# Patient Record
Sex: Male | Born: 1985 | Race: Black or African American | Hispanic: No | Marital: Single | Smoking: Current every day smoker
Health system: Southern US, Community
[De-identification: ages and names within clinical notes are randomized; demographics above are authoritative.]

## PROBLEM LIST (undated history)

## (undated) DIAGNOSIS — F329 Major depressive disorder, single episode, unspecified: Secondary | ICD-10-CM

## (undated) DIAGNOSIS — T4145XA Adverse effect of unspecified anesthetic, initial encounter: Secondary | ICD-10-CM

## (undated) DIAGNOSIS — Z87442 Personal history of urinary calculi: Secondary | ICD-10-CM

## (undated) DIAGNOSIS — J45909 Unspecified asthma, uncomplicated: Secondary | ICD-10-CM

## (undated) DIAGNOSIS — F32A Depression, unspecified: Secondary | ICD-10-CM

## (undated) DIAGNOSIS — Z8719 Personal history of other diseases of the digestive system: Secondary | ICD-10-CM

## (undated) DIAGNOSIS — Z8711 Personal history of peptic ulcer disease: Secondary | ICD-10-CM

## (undated) DIAGNOSIS — T8859XA Other complications of anesthesia, initial encounter: Secondary | ICD-10-CM

## (undated) DIAGNOSIS — F1410 Cocaine abuse, uncomplicated: Secondary | ICD-10-CM

## (undated) HISTORY — DX: Personal history of other diseases of the digestive system: Z87.19

## (undated) HISTORY — DX: Depression, unspecified: F32.A

## (undated) HISTORY — DX: Major depressive disorder, single episode, unspecified: F32.9

## (undated) HISTORY — DX: Personal history of peptic ulcer disease: Z87.11

## (undated) HISTORY — DX: Unspecified asthma, uncomplicated: J45.909

## (undated) HISTORY — DX: Personal history of urinary calculi: Z87.442

---

## 2005-10-23 ENCOUNTER — Other Ambulatory Visit: Payer: Self-pay

## 2005-10-23 ENCOUNTER — Emergency Department: Payer: Self-pay | Admitting: Emergency Medicine

## 2006-10-03 ENCOUNTER — Emergency Department: Payer: Self-pay | Admitting: Emergency Medicine

## 2012-07-06 ENCOUNTER — Emergency Department: Payer: Self-pay | Admitting: Emergency Medicine

## 2012-07-08 LAB — BETA STREP CULTURE(ARMC)

## 2012-08-02 ENCOUNTER — Emergency Department: Payer: Self-pay | Admitting: Unknown Physician Specialty

## 2012-08-02 LAB — URINALYSIS, COMPLETE
Blood: NEGATIVE
Glucose,UR: NEGATIVE mg/dL (ref 0–75)
Ketone: NEGATIVE
Ph: 7 (ref 4.5–8.0)
Protein: NEGATIVE
RBC,UR: 1 /HPF (ref 0–5)
Specific Gravity: 1.021 (ref 1.003–1.030)
WBC UR: 4 /HPF (ref 0–5)

## 2012-08-02 LAB — CBC
HCT: 51.9 % (ref 40.0–52.0)
HGB: 17.2 g/dL (ref 13.0–18.0)
Platelet: 201 10*3/uL (ref 150–440)
RBC: 5.92 10*6/uL — ABNORMAL HIGH (ref 4.40–5.90)
RDW: 13.9 % (ref 11.5–14.5)
WBC: 10.9 10*3/uL — ABNORMAL HIGH (ref 3.8–10.6)

## 2012-08-02 LAB — BASIC METABOLIC PANEL
Anion Gap: 4 — ABNORMAL LOW (ref 7–16)
BUN: 19 mg/dL — ABNORMAL HIGH (ref 7–18)
Calcium, Total: 9.1 mg/dL (ref 8.5–10.1)
Chloride: 105 mmol/L (ref 98–107)
Co2: 29 mmol/L (ref 21–32)
Creatinine: 0.98 mg/dL (ref 0.60–1.30)
EGFR (African American): >60
EGFR (Non-African Amer.): >60
Glucose: 88 mg/dL (ref 65–99)
Osmolality: 277 (ref 275–301)
Potassium: 3.9 mmol/L (ref 3.5–5.1)

## 2012-08-02 LAB — HCG, QUANTITATIVE, PREGNANCY: Beta Hcg, Quant.: <1 m[IU]/mL — ABNORMAL LOW

## 2012-08-02 LAB — LACTATE DEHYDROGENASE: LDH: 203 U/L (ref 85–241)

## 2012-08-04 LAB — AFP TUMOR MARKER: AFP-Tumor Marker: 1.7 ng/mL (ref 0.0–8.3)

## 2013-06-17 DIAGNOSIS — F17200 Nicotine dependence, unspecified, uncomplicated: Secondary | ICD-10-CM | POA: Insufficient documentation

## 2015-01-20 ENCOUNTER — Encounter
Admission: EM | Disposition: A | Discharge status: Home / Self Care | Payer: Self-pay | Source: Home / Self Care | Attending: Emergency Medicine

## 2015-01-20 ENCOUNTER — Emergency Department: Payer: Self-pay | Admitting: Anesthesiology

## 2015-01-20 ENCOUNTER — Encounter: Payer: Self-pay | Admitting: Emergency Medicine

## 2015-01-20 ENCOUNTER — Emergency Department: Payer: Self-pay

## 2015-01-20 ENCOUNTER — Ambulatory Visit
Admission: EM | Admit: 2015-01-20 | Discharge: 2015-01-21 | Disposition: A | Discharge status: Home / Self Care | Payer: Self-pay | Attending: Urology | Admitting: Urology

## 2015-01-20 DIAGNOSIS — N132 Hydronephrosis with renal and ureteral calculous obstruction: Secondary | ICD-10-CM | POA: Insufficient documentation

## 2015-01-20 DIAGNOSIS — N200 Calculus of kidney: Secondary | ICD-10-CM

## 2015-01-20 DIAGNOSIS — N134 Hydroureter: Secondary | ICD-10-CM | POA: Insufficient documentation

## 2015-01-20 DIAGNOSIS — N1330 Unspecified hydronephrosis: Secondary | ICD-10-CM

## 2015-01-20 DIAGNOSIS — N201 Calculus of ureter: Secondary | ICD-10-CM

## 2015-01-20 DIAGNOSIS — Z91018 Allergy to other foods: Secondary | ICD-10-CM | POA: Insufficient documentation

## 2015-01-20 DIAGNOSIS — F17200 Nicotine dependence, unspecified, uncomplicated: Secondary | ICD-10-CM | POA: Insufficient documentation

## 2015-01-20 DIAGNOSIS — Z91048 Other nonmedicinal substance allergy status: Secondary | ICD-10-CM | POA: Insufficient documentation

## 2015-01-20 DIAGNOSIS — R109 Unspecified abdominal pain: Secondary | ICD-10-CM

## 2015-01-20 DIAGNOSIS — R112 Nausea with vomiting, unspecified: Secondary | ICD-10-CM

## 2015-01-20 DIAGNOSIS — R1011 Right upper quadrant pain: Secondary | ICD-10-CM

## 2015-01-20 HISTORY — PX: CYSTOSCOPY WITH STENT PLACEMENT: SHX5790

## 2015-01-20 LAB — CBC WITH DIFFERENTIAL/PLATELET
BASOS ABS: 0.1 10*3/uL (ref 0–0.1)
BASOS PCT: 1 %
EOS ABS: 0.2 10*3/uL (ref 0–0.7)
EOS PCT: 2 %
HCT: 50.8 % (ref 40.0–52.0)
Hemoglobin: 16.7 g/dL (ref 13.0–18.0)
Lymphocytes Relative: 27 %
Lymphs Abs: 3.3 10*3/uL (ref 1.0–3.6)
MCH: 28.7 pg (ref 26.0–34.0)
MCHC: 32.8 g/dL (ref 32.0–36.0)
MCV: 87.5 fL (ref 80.0–100.0)
MONO ABS: 0.9 10*3/uL (ref 0.2–1.0)
Monocytes Relative: 8 %
Neutro Abs: 7.8 10*3/uL — ABNORMAL HIGH (ref 1.4–6.5)
Neutrophils Relative %: 62 %
PLATELETS: 213 10*3/uL (ref 150–440)
RBC: 5.81 MIL/uL (ref 4.40–5.90)
RDW: 13.6 % (ref 11.5–14.5)
WBC: 12.3 10*3/uL — ABNORMAL HIGH (ref 3.8–10.6)

## 2015-01-20 LAB — BASIC METABOLIC PANEL
ANION GAP: 9 (ref 5–15)
BUN: 14 mg/dL (ref 6–20)
CALCIUM: 9.6 mg/dL (ref 8.9–10.3)
CO2: 26 mmol/L (ref 22–32)
Chloride: 105 mmol/L (ref 101–111)
Creatinine, Ser: 1.13 mg/dL (ref 0.61–1.24)
GLUCOSE: 112 mg/dL — AB (ref 65–99)
Potassium: 3.4 mmol/L — ABNORMAL LOW (ref 3.5–5.1)
Sodium: 140 mmol/L (ref 135–145)

## 2015-01-20 LAB — URINALYSIS COMPLETE WITH MICROSCOPIC (ARMC ONLY)
Bilirubin Urine: NEGATIVE
Glucose, UA: NEGATIVE mg/dL
LEUKOCYTES UA: NEGATIVE
NITRITE: NEGATIVE
PROTEIN: 100 mg/dL — AB
SQUAMOUS EPITHELIAL / LPF: NONE SEEN
Specific Gravity, Urine: 1.027 (ref 1.005–1.030)
pH: 6 (ref 5.0–8.0)

## 2015-01-20 LAB — URINE DRUG SCREEN, QUALITATIVE (ARMC ONLY)
AMPHETAMINES, UR SCREEN: NOT DETECTED
Barbiturates, Ur Screen: NOT DETECTED
Benzodiazepine, Ur Scrn: NOT DETECTED
Cannabinoid 50 Ng, Ur ~~LOC~~: NOT DETECTED
Cocaine Metabolite,Ur ~~LOC~~: POSITIVE — AB
MDMA (ECSTASY) UR SCREEN: NOT DETECTED
METHADONE SCREEN, URINE: NOT DETECTED
Opiate, Ur Screen: POSITIVE — AB
PHENCYCLIDINE (PCP) UR S: NOT DETECTED
Tricyclic, Ur Screen: NOT DETECTED

## 2015-01-20 SURGERY — CYSTOSCOPY, WITH STENT INSERTION
Anesthesia: General | Site: Ureter | Laterality: Right | Wound class: Clean Contaminated

## 2015-01-20 MED ORDER — FENTANYL CITRATE (PF) 100 MCG/2ML IJ SOLN
INTRAMUSCULAR | Status: DC | PRN
  Administered 2015-01-20 (×2): 50 ug via INTRAVENOUS

## 2015-01-20 MED ORDER — LACTATED RINGERS IV SOLN
INTRAVENOUS | Status: DC | PRN
  Administered 2015-01-20: 14:00:00 via INTRAVENOUS

## 2015-01-20 MED ORDER — SENNOSIDES-DOCUSATE SODIUM 8.6-50 MG PO TABS
2.0000 | ORAL_TABLET | Freq: Every day | ORAL | Status: DC
  Filled 2015-01-20: qty 2

## 2015-01-20 MED ORDER — ACETAMINOPHEN 325 MG PO TABS: 650.0000 mg | ORAL_TABLET | ORAL | Status: DC | PRN

## 2015-01-20 MED ORDER — KETOROLAC TROMETHAMINE 30 MG/ML IJ SOLN
30.0000 mg | Freq: Once | INTRAMUSCULAR | Status: AC
  Administered 2015-01-20: 30 mg via INTRAVENOUS
  Filled 2015-01-20: qty 1

## 2015-01-20 MED ORDER — DIPHENHYDRAMINE HCL 50 MG/ML IJ SOLN: 12.5000 mg | Freq: Four times a day (QID) | INTRAMUSCULAR | Status: DC | PRN

## 2015-01-20 MED ORDER — LIDOCAINE HCL (CARDIAC) 20 MG/ML IV SOLN
INTRAVENOUS | Status: DC | PRN
  Administered 2015-01-20: 60 mg via INTRAVENOUS

## 2015-01-20 MED ORDER — ZOLPIDEM TARTRATE 5 MG PO TABS: 5.0000 mg | ORAL_TABLET | Freq: Every evening | ORAL | Status: DC | PRN

## 2015-01-20 MED ORDER — HYDROMORPHONE HCL 1 MG/ML IJ SOLN
1.0000 mg | Freq: Once | INTRAMUSCULAR | Status: AC
  Administered 2015-01-20: 1 mg via INTRAVENOUS

## 2015-01-20 MED ORDER — OXYCODONE-ACETAMINOPHEN 5-325 MG PO TABS
1.0000 | ORAL_TABLET | ORAL | Status: DC | PRN
  Administered 2015-01-20: 1 via ORAL
  Administered 2015-01-21: 2 via ORAL
  Filled 2015-01-20 (×3): qty 1

## 2015-01-20 MED ORDER — FENTANYL CITRATE (PF) 100 MCG/2ML IJ SOLN: 25.0000 ug | INTRAMUSCULAR | Status: DC | PRN

## 2015-01-20 MED ORDER — PROPOFOL 10 MG/ML IV BOLUS
INTRAVENOUS | Status: DC | PRN
  Administered 2015-01-20: 200 mg via INTRAVENOUS

## 2015-01-20 MED ORDER — IOTHALAMATE MEGLUMINE 17.2 % UR SOLN
URETHRAL | Status: DC | PRN
  Administered 2015-01-20: 10 mL via URETHRAL

## 2015-01-20 MED ORDER — DIPHENHYDRAMINE HCL 12.5 MG/5ML PO ELIX: 12.5000 mg | ORAL_SOLUTION | Freq: Four times a day (QID) | ORAL | Status: DC | PRN

## 2015-01-20 MED ORDER — ONDANSETRON HCL 4 MG/2ML IJ SOLN
INTRAMUSCULAR | Status: AC
  Administered 2015-01-20: 4 mg via INTRAVENOUS
  Filled 2015-01-20: qty 2

## 2015-01-20 MED ORDER — ONDANSETRON HCL 4 MG/2ML IJ SOLN
4.0000 mg | Freq: Once | INTRAMUSCULAR | Status: AC
  Administered 2015-01-20: 4 mg via INTRAVENOUS
  Filled 2015-01-20: qty 2

## 2015-01-20 MED ORDER — CEFAZOLIN SODIUM-DEXTROSE 2-3 GM-% IV SOLR
INTRAVENOUS | Status: DC | PRN
  Administered 2015-01-20: 2 g via INTRAVENOUS

## 2015-01-20 MED ORDER — PNEUMOCOCCAL VAC POLYVALENT 25 MCG/0.5ML IJ INJ
0.5000 mL | INJECTION | INTRAMUSCULAR | Status: AC
  Administered 2015-01-21: 0.5 mL via INTRAMUSCULAR
  Filled 2015-01-20: qty 0.5

## 2015-01-20 MED ORDER — ONDANSETRON HCL 4 MG/2ML IJ SOLN: 4.0000 mg | Freq: Once | INTRAMUSCULAR | Status: DC | PRN

## 2015-01-20 MED ORDER — MAGNESIUM CITRATE PO SOLN
1.0000 | Freq: Once | ORAL | Status: DC | PRN
  Filled 2015-01-20: qty 296

## 2015-01-20 MED ORDER — OXYCODONE-ACETAMINOPHEN 10-325 MG PO TABS: 1.0000 | ORAL_TABLET | ORAL | Status: DC | PRN

## 2015-01-20 MED ORDER — SODIUM CHLORIDE 0.9 % IV BOLUS (SEPSIS)
1000.0000 mL | Freq: Once | INTRAVENOUS | Status: AC
  Administered 2015-01-20: 1000 mL via INTRAVENOUS

## 2015-01-20 MED ORDER — NEOSTIGMINE METHYLSULFATE 10 MG/10ML IV SOLN
INTRAVENOUS | Status: DC | PRN
  Administered 2015-01-20: 2 mg via INTRAVENOUS

## 2015-01-20 MED ORDER — HYDROMORPHONE HCL 1 MG/ML IJ SOLN
0.5000 mg | INTRAMUSCULAR | Status: DC | PRN
  Administered 2015-01-21 (×2): 1 mg via INTRAVENOUS
  Filled 2015-01-20 (×2): qty 1

## 2015-01-20 MED ORDER — HYDROMORPHONE HCL 1 MG/ML IJ SOLN
1.0000 mg | Freq: Once | INTRAMUSCULAR | Status: AC
  Administered 2015-01-20: 1 mg via INTRAVENOUS
  Filled 2015-01-20: qty 1

## 2015-01-20 MED ORDER — POLYETHYLENE GLYCOL 3350 17 G PO PACK: 17.0000 g | PACK | Freq: Every day | ORAL | Status: DC | PRN

## 2015-01-20 MED ORDER — GLYCOPYRROLATE 0.2 MG/ML IJ SOLN
INTRAMUSCULAR | Status: DC | PRN
  Administered 2015-01-20: 0.4 mg via INTRAVENOUS

## 2015-01-20 MED ORDER — OXYBUTYNIN CHLORIDE 5 MG PO TABS: 5.0000 mg | ORAL_TABLET | Freq: Three times a day (TID) | ORAL | Status: DC

## 2015-01-20 MED ORDER — ROCURONIUM BROMIDE 100 MG/10ML IV SOLN
INTRAVENOUS | Status: DC | PRN
  Administered 2015-01-20: 15 mg via INTRAVENOUS
  Administered 2015-01-20: 5 mg via INTRAVENOUS

## 2015-01-20 MED ORDER — DEXMEDETOMIDINE HCL 200 MCG/2ML IV SOLN
INTRAVENOUS | Status: DC | PRN
  Administered 2015-01-20: 10 ug via INTRAVENOUS

## 2015-01-20 MED ORDER — BELLADONNA ALKALOIDS-OPIUM 16.2-60 MG RE SUPP
RECTAL | Status: DC | PRN
  Administered 2015-01-20: 1 via RECTAL

## 2015-01-20 MED ORDER — BISACODYL 10 MG RE SUPP: 10.0000 mg | Freq: Every day | RECTAL | Status: DC | PRN

## 2015-01-20 MED ORDER — HYDROMORPHONE HCL 1 MG/ML IJ SOLN
INTRAMUSCULAR | Status: AC
  Administered 2015-01-20: 1 mg via INTRAVENOUS
  Filled 2015-01-20: qty 1

## 2015-01-20 MED ORDER — FENTANYL CITRATE (PF) 100 MCG/2ML IJ SOLN
INTRAMUSCULAR | Status: AC
  Filled 2015-01-20: qty 2

## 2015-01-20 MED ORDER — TAMSULOSIN HCL 0.4 MG PO CAPS: 0.4000 mg | ORAL_CAPSULE | Freq: Every day | ORAL | Status: DC

## 2015-01-20 MED ORDER — HYDROMORPHONE HCL 1 MG/ML IJ SOLN
1.0000 mg | INTRAMUSCULAR | Status: AC
  Administered 2015-01-20: 1 mg via INTRAVENOUS
  Filled 2015-01-20: qty 1

## 2015-01-20 MED ORDER — SUCCINYLCHOLINE CHLORIDE 20 MG/ML IJ SOLN
INTRAMUSCULAR | Status: DC | PRN
  Administered 2015-01-20: 100 mg via INTRAVENOUS

## 2015-01-20 MED ORDER — MIDAZOLAM HCL 2 MG/2ML IJ SOLN
INTRAMUSCULAR | Status: DC | PRN
  Administered 2015-01-20: 2 mg via INTRAVENOUS

## 2015-01-20 MED ORDER — ONDANSETRON HCL 4 MG/2ML IJ SOLN
4.0000 mg | Freq: Once | INTRAMUSCULAR | Status: AC
  Administered 2015-01-20: 4 mg via INTRAVENOUS

## 2015-01-20 MED ORDER — KETOROLAC TROMETHAMINE 30 MG/ML IJ SOLN
30.0000 mg | Freq: Four times a day (QID) | INTRAMUSCULAR | Status: DC
  Administered 2015-01-20 – 2015-01-21 (×3): 30 mg via INTRAVENOUS
  Filled 2015-01-20 (×3): qty 1

## 2015-01-20 MED ORDER — DIAZEPAM 5 MG PO TABS: 5.0000 mg | ORAL_TABLET | Freq: Three times a day (TID) | ORAL | Status: DC | PRN

## 2015-01-20 MED ORDER — ONDANSETRON HCL 4 MG/2ML IJ SOLN: 4.0000 mg | INTRAMUSCULAR | Status: DC | PRN

## 2015-01-20 SURGICAL SUPPLY — 23 items
BAG URO CATCHER STRL LF (DRAPE) ×3 IMPLANT
BASKET LASER NITINOL 1.9FR (BASKET) IMPLANT
BASKET STONE 1.7 NGAGE (UROLOGICAL SUPPLIES) IMPLANT
BASKET ZERO TIP NITINOL 2.4FR (BASKET) IMPLANT
CANISTER SUCT LVC 12 LTR MEDI- (MISCELLANEOUS) IMPLANT
CATH INTERMIT  6FR 70CM (CATHETERS) IMPLANT
CATH URETL OPEN END 6X70 (CATHETERS) ×3 IMPLANT
CLOTH BEACON ORANGE TIMEOUT ST (SAFETY) ×3 IMPLANT
FIBER LASER FLEXIVA 365 (UROLOGICAL SUPPLIES) IMPLANT
FIBER LASER TRAC TIP (UROLOGICAL SUPPLIES) IMPLANT
GLOVE BIO SURGEON STRL SZ8 (GLOVE) IMPLANT
GOWN STRL REUS W/ TWL LRG LVL3 (GOWN DISPOSABLE) ×1 IMPLANT
GOWN STRL REUS W/ TWL XL LVL3 (GOWN DISPOSABLE) ×1 IMPLANT
GOWN STRL REUS W/TWL LRG LVL3 (GOWN DISPOSABLE) ×2
GOWN STRL REUS W/TWL XL LVL3 (GOWN DISPOSABLE) ×2
GUIDEWIRE ANG ZIPWIRE 038X150 (WIRE) ×3 IMPLANT
GUIDEWIRE STR DUAL SENSOR (WIRE) IMPLANT
IV NS IRRIG 3000ML ARTHROMATIC (IV SOLUTION) ×3 IMPLANT
MANIFOLD NEPTUNE II (INSTRUMENTS) IMPLANT
PACK CYSTO (CUSTOM PROCEDURE TRAY) ×3 IMPLANT
STENT URET 6FRX26 CONTOUR (STENTS) ×3 IMPLANT
SYRINGE 10CC LL (SYRINGE) ×3 IMPLANT
TUBE FEEDING 8FR 16IN STR KANG (MISCELLANEOUS) IMPLANT

## 2015-01-20 NOTE — H&P (Signed)
   Urology Consult  I have been asked to see the patient by Dr. Quale, for evaluation and management of  9 x 5 x 4 mm calculus in the right proximal ureter associated with mild right hydronephrosis and hydroureter proximal to this stone.    Chief Complaint: Intractable right flank pain   History of Present Illness: Nicolas Shaw is a 29 y.o. year old African-American male who presented to the emergency room earlier today complaining of an intractable right sided flank pain.  The patient's mother is present with him in the room. I have obtained the history of this patient from his mother due to patient's extreme lethargy caused by pain medication.  Patient's mother states that earlier in the evening yesterday he started complaining of a right sided pain. He described it as a nagging pain at that time and continued to work. As the evening progressed, the pain increased and became so intense that after his shift his mother brought him to the emergency room.  She states that she did not know if he had fevers or chills, but he had nausea and some vomiting.  She is unsure if he has had gross hematuria, dysuria or urinary urgency.  She denies that he has a prior history of kidney stone disease.    A CT scan was obtained and is on the 9 x 4 x 5 mm calculus in the right proximal ureter associated with right hydronephrosis and hydroureter. It also found a 1.9 cm hypodense lesion of the right upper pole of the kidney.  Dr. McKenzie was consult it and patient will be scheduled for an emergent right ureteral stent placement.  History obtained from the ED notes:  Patient was seen at [facility] 3 weeks ago for similar complaints along with hematuria. He had a CT scan and states he was diagnosed with a "small kidney stone" but was unable to follow up with urology due to transportation and financial issues. Complains of symptoms associated with nausea. Denies fever, chills, chest pain, shortness of breath, vomiting,  diarrhea, dysuria, testicular pain or swelling.  History reviewed. No pertinent past medical history.  History reviewed. No pertinent past surgical history.  Home Medications:    Medication List    Notice    You have not been prescribed any medications.      Allergies:  Allergies  Allergen Reactions  . Other Swelling    Pt reports allergic to ALL fruits except peaches and watermelon.  . Silver Swelling    Tongue and throat    No family history on file.  Social History: Patient has a history of cocaine use.  ROS: A complete review of systems was performed.  All systems are negative except for pertinent findings as noted.  Physical Exam:  Vital signs in last 24 hours: Temp:  [98 F (36.7 C)] 98 F (36.7 C) (09/01 0614) Pulse Rate:  [56-84] 56 (09/01 0900) Resp:  [18] 18 (09/01 0614) BP: (142-170)/(89-115) 146/96 mmHg (09/01 0900) SpO2:  [97 %-99 %] 97 % (09/01 0900) Weight:  [205 lb (92.987 kg)] 205 lb (92.987 kg) (09/01 0614) Constitutional:  Lethargic, No acute distress. HEENT: [place] AT, moist mucus membranes.  Trachea midline, no masses Cardiovascular: Regular rate and rhythm, no clubbing, cyanosis, or edema. Respiratory: Normal respiratory effort, lungs clear bilaterally GI: Abdomen is soft, nontender, nondistended, no abdominal masses GU: No CVA tenderness Skin: No rashes, bruises or suspicious lesions Lymph: No cervical or inguinal adenopathy Neurologic: Grossly intact, no focal deficits,   moving all 4 extremities Psychiatric: Normal mood and affect   Laboratory Data:   Recent Labs  01/20/15 0626  WBC 12.3*  HGB 16.7  HCT 50.8    Recent Labs  01/20/15 0626  NA 140  K 3.4*  CL 105  CO2 26  GLUCOSE 112*  BUN 14  CREATININE 1.13  CALCIUM 9.6   No results for input(s): LABPT, INR in the last 72 hours. No results for input(s): LABURIN in the last 72 hours. Results for orders placed or performed in visit on 07/06/12  Beta Strep Culture ([facility])      Status: None   Collection Time: 07/06/12  2:36 AM  Result Value Ref Range Status   Micro Text Report   Final       SOURCE: THROAT    COMMENT                   NO BETA STREPTOCOCCUS ISOLATED IN 48 HOURS   ANTIBIOTIC                                                         Radiologic Imaging: Dg Abd 1 View  01/20/2015   CLINICAL DATA:  Right-sided abdominal pain.  EXAM: ABDOMEN - 1 VIEW  COMPARISON:  None.  FINDINGS: The bowel gas pattern is unremarkable. There is scattered stool in the colon. No distended small bowel loops to suggest obstruction. No free air. The soft tissue shadows are maintained. Linear radiodensity overlying the right L2 transverse process could potentially be a ureteral calculus. The bony structures are unremarkable.  IMPRESSION: Unremarkable bowel gas pattern.  Possible upper right ureteral calculus. CT urogram may be helpful for further evaluation if clinically necessary   Electronically Signed   By: P.  Gallerani M.D.   On: 01/20/2015 07:24   Ct Renal Stone Study  01/20/2015   CLINICAL DATA:  Right lower abdominal pain and flank pain for 45 minutes. Prior hematuria and renal calculus.  EXAM: CT ABDOMEN AND PELVIS WITHOUT CONTRAST  TECHNIQUE: Multidetector CT imaging of the abdomen and pelvis was performed following the standard protocol without IV contrast.  COMPARISON:  01/20/2015  FINDINGS: Lower chest:  Unremarkable  Hepatobiliary: Unremarkable  Pancreas: Unremarkable  Spleen: Unremarkable  Adrenals/Urinary Tract: 9 by 5 by 4 mm calculus in the right proximal ureter associated with mild right hydronephrosis and hydroureter proximal to this stone.  1-2 mm left kidney upper pole nonobstructive calculus. Hypodense lesion in the right kidney upper pole measuring 1.9 cm in diameter, with very faint calcifications along its margin.  Stomach/Bowel: Unremarkable  Vascular/Lymphatic: Unremarkable  Reproductive: Unremarkable  Other: No supplemental non-categorized findings.   Musculoskeletal: Unremarkable  IMPRESSION: 1. 9 by 5 by 4 mm mildly obstructive right proximal ureteral calculus with associated mild right hydronephrosis. 2. There is also a 2 mm nonobstructive left kidney upper pole calculus. 3. 1.9 cm hypodense lesion of the right kidney upper pole has faint peripheral calcifications. Although statistically most likely to be a complex cyst, a solid mass is not entirely excluded and a Bosniak classification cannot be assigned due to lack of IV contrast. Definitive workup by renal protocol MRI with and without contrast should be considered; alternatives might include renal protocol CT with and without contrast or ultrasound.   Electronically Signed   By: Walter  Liebkemann   M.D.   On: 01/20/2015 08:14    Impression/Assessment:  Patient with a calculus in the right proximal ureter associated with mid right hydronephrosis and hydroureter proximal to the stone. His pain is intractable.  There is also a finding of a 1.9 cm hypodense lesion of the right kidney upper pole which has faint peripheral calcifications.   Plan:  Patient will be scheduled for an emergent right ureteral stent placement. The procedures and risk involved are explained to the patient's mother. She voices her understanding.  After the ureteral stent placement, patient will be discharged to home and will follow-up with urology in 1-2 weeks for definitive treatment for his proximal stone.  The hypodense lesion of the right kidney will also be worked up as an outpatient.   01/20/2015, 10:32 AM   Thank you for involving me in this patient's care, I will continue to follow along.Please page with any further questions or concerns. Daryn Pisani, PA-C

## 2015-01-20 NOTE — ED Provider Notes (Signed)
[facility] Emergency Department Provider Note  ____________________________________________  Time seen: Approximately 6:23 AM  I have reviewed the triage vital signs and the nursing notes.   HISTORY  Chief Complaint Abdominal Pain    HPI Ellington Greenslade is a 29 y.o. male who presents to the ED from home with a chief complain of right flank pain. Patient presents with sudden onset right flank pain approximately 45 minutes ago. Describes waxing/waning sharp pain radiating to right lower quadrant. Patient was seen at [facility] 3 weeks ago for similar complaints along with hematuria. He had a CT scan and states he was diagnosed with a "small kidney stone" but was unable to follow up with urology due to transportation and financial issues. Complains of symptoms associated with nausea. Denies fever, chills, chest pain, shortness of breath, vomiting, diarrhea, dysuria, testicular pain or swelling.   Past medical history None   There are no active problems to display for this patient.   History reviewed. No pertinent past surgical history.  No current outpatient prescriptions on file.  Allergies Silver  Family history Diabetes   Social History Social History  Substance Use Topics  . Smoking status: None  . Smokeless tobacco: None  . Alcohol Use: None  Smoker  Review of Systems Constitutional: No fever/chills Eyes: No visual changes. ENT: No sore throat. Cardiovascular: Denies chest pain. Respiratory: Denies shortness of breath. Gastrointestinal: No abdominal pain.  No nausea, no vomiting.  No diarrhea.  No constipation. Genitourinary: Positive for hematuria. Negative for dysuria. Musculoskeletal: Positive for back pain. Skin: Negative for rash. Neurological: Negative for headaches, focal weakness or numbness.  10-point ROS otherwise negative.  ____________________________________________   PHYSICAL EXAM:  VITAL SIGNS: ED Triage Vitals  Enc  Vitals Group     BP 01/20/15 0614 170/115 mmHg     Pulse Rate 01/20/15 0614 73     Resp 01/20/15 0614 18     Temp 01/20/15 0614 98 F (36.7 C)     Temp Source 01/20/15 0614 Oral     SpO2 01/20/15 0614 99 %     Weight 01/20/15 0614 205 lb (92.987 kg)     Height 01/20/15 0614 6\' 1"  (1.854 m)     Head Cir --      Peak Flow --      Pain Score 01/20/15 0614 10     Pain Loc --      Pain Edu? --      Excl. in GC? --     Constitutional: Alert and oriented. Well appearing and in moderate acute distress. Eyes: Conjunctivae are normal. PERRL. EOMI. Head: Atraumatic. Nose: No congestion/rhinnorhea. Mouth/Throat: Mucous membranes are moist.  Oropharynx non-erythematous. Neck: No stridor.   Cardiovascular: Normal rate, regular rhythm. Grossly normal heart sounds.  Good peripheral circulation. Respiratory: Normal respiratory effort.  No retractions. Lungs CTAB. Gastrointestinal: Soft and nontender. No distention. No abdominal bruits. Right CVA tenderness. Genitourinary: No testicular pain or swelling. No palpable masses. Cremaster reflexes strong bilaterally. Musculoskeletal: No lower extremity tenderness nor edema.  No joint effusions. Neurologic:  Normal speech and language. No gross focal neurologic deficits are appreciated.  Skin:  Skin is warm, dry and intact. No rash noted. Psychiatric: Mood and affect are normal. Speech and behavior are normal.  ____________________________________________   LABS (all labs ordered are listed, but only abnormal results are displayed)  Labs Reviewed  CBC WITH DIFFERENTIAL/PLATELET  BASIC METABOLIC PANEL  URINALYSIS COMPLETEWITH MICROSCOPIC ([facility] ONLY)   ____________________________________________  EKG  None ____________________________________________  RADIOLOGY  KUB pending ____________________________________________   PROCEDURES  Procedure(s) performed: None  Critical Care performed:  No  ____________________________________________   INITIAL IMPRESSION / ASSESSMENT AND PLAN / ED COURSE  Pertinent labs & imaging results that were available during my care of the patient were reviewed by me and considered in my medical decision making (see chart for details).  29 year old male who presents with sudden onset right flank to right lower quadrant pain and hematuria suspicious for ureteral colic. [facility] of [facility] scan [facility] demonstrates a 7 mm right renal stone at that time. Concern that stone has moved into the ureter and is the cause of patient's pain tonight. Will start with KUB. Initiate IV fluid resuscitation, IV analgesia and antiemetic.  ----------------------------------------- 7:06 AM on 01/20/2015 -----------------------------------------  Patient complains of returning pain after x-ray. Will re-dose IV analgesia and antiemetic. Care transferred to Dr. Fanny Bien pending KUB. ____________________________________________   FINAL CLINICAL IMPRESSION(S) / ED DIAGNOSES  Final diagnoses:  Acute right flank pain      Irean Hong, MD 01/20/15 [phone]

## 2015-01-20 NOTE — Anesthesia Preprocedure Evaluation (Addendum)
Anesthesia Evaluation  Patient identified by MRN, date of birth, ID band Patient awake    Reviewed: Allergy & Precautions, NPO status , Patient's Chart, lab work & pertinent test results  Airway Mallampati: II  TM Distance: >3 FB Neck ROM: Full    Dental  (+) Chipped   Pulmonary neg pulmonary ROS,  breath sounds clear to auscultation  Pulmonary exam normal       Cardiovascular negative cardio ROS Normal cardiovascular exam    Neuro/Psych negative neurological ROS  negative psych ROS   GI/Hepatic negative GI ROS, Neg liver ROS,   Endo/Other  negative endocrine ROS  Renal/GU negative Renal ROS  negative genitourinary   Musculoskeletal negative musculoskeletal ROS (+)   Abdominal Normal abdominal exam  (+)   Peds negative pediatric ROS (+)  Hematology negative hematology ROS (+)   Anesthesia Other Findings   Reproductive/Obstetrics                            Anesthesia Physical Anesthesia Plan  ASA: II  Anesthesia Plan: General   Post-op Pain Management:    Induction: Intravenous and Rapid sequence  Airway Management Planned: Oral ETT  Additional Equipment:   Intra-op Plan:   Post-operative Plan: Extubation in OR  Informed Consent: I have reviewed the patients History and Physical, chart, labs and discussed the procedure including the risks, benefits and alternatives for the proposed anesthesia with the patient or authorized representative who has indicated his/her understanding and acceptance.   Dental advisory given  Plan Discussed with: CRNA and Surgeon  Anesthesia Plan Comments:        Anesthesia Quick Evaluation

## 2015-01-20 NOTE — ED Notes (Signed)
Patient transported to X-ray 

## 2015-01-20 NOTE — Brief Op Note (Signed)
01/20/2015  2:13 PM  PATIENT:  Gerrit Friends  29 y.o. male  PRE-OPERATIVE DIAGNOSIS:  right kidney stone  POST-OPERATIVE DIAGNOSIS:  right kidney stone  PROCEDURE:  Procedure(s): CYSTOSCOPY WITH STENT PLACEMENT (Right)  SURGEON:  Surgeon(s) and Role:    * Malen Gauze, MD - Primary  PHYSICIAN ASSISTANT:   ASSISTANTS: none   ANESTHESIA:   general  EBL:     BLOOD ADMINISTERED:none  DRAINS: R 6x26 JJ ureteral stent  LOCAL MEDICATIONS USED:  NONE  SPECIMEN:  No Specimen  DISPOSITION OF SPECIMEN:  N/A  COUNTS:  YES  TOURNIQUET:  * No tourniquets in log *  DICTATION: .Note written in EPIC  PLAN OF CARE: Discharge to home after PACU  PATIENT DISPOSITION:  PACU - hemodynamically stable.   Delay start of Pharmacological VTE agent (>24hrs) due to surgical blood loss or risk of bleeding: not applicable

## 2015-01-20 NOTE — Discharge Instructions (Signed)
Kidney Stones °Kidney stones (urolithiasis) are deposits that form inside your kidneys. The intense pain is caused by the stone moving through the urinary tract. When the stone moves, the ureter goes into spasm around the stone. The stone is usually passed in the urine.  °CAUSES  °· A disorder that makes certain neck glands produce too much parathyroid hormone (primary hyperparathyroidism). °· A buildup of uric acid crystals, similar to gout in your joints. °· Narrowing (stricture) of the ureter. °· A kidney obstruction present at birth (congenital obstruction). °· Previous surgery on the kidney or ureters. °· Numerous kidney infections. °SYMPTOMS  °· Feeling sick to your stomach (nauseous). °· Throwing up (vomiting). °· Blood in the urine (hematuria). °· Pain that usually spreads (radiates) to the groin. °· Frequency or urgency of urination. °DIAGNOSIS  °· Taking a history and physical exam. °· Blood or urine tests. °· CT scan. °· Occasionally, an examination of the inside of the urinary bladder (cystoscopy) is performed. °TREATMENT  °· Observation. °· Increasing your fluid intake. °· Extracorporeal shock wave lithotripsy--This is a noninvasive procedure that uses shock waves to break up kidney stones. °· Surgery may be needed if you have severe pain or persistent obstruction. There are various surgical procedures. Most of the procedures are performed with the use of small instruments. Only small incisions are needed to accommodate these instruments, so recovery time is minimized. °The size, location, and chemical composition are all important variables that will determine the proper choice of action for you. Talk to your health care provider to better understand your situation so that you will minimize the risk of injury to yourself and your kidney.  °HOME CARE INSTRUCTIONS  °· Drink enough water and fluids to keep your urine clear or pale yellow. This will help you to pass the stone or stone fragments. °· Strain  all urine through the provided strainer. Keep all particulate matter and stones for your health care provider to see. The stone causing the pain may be as small as a grain of salt. It is very important to use the strainer each and every time you pass your urine. The collection of your stone will allow your health care provider to analyze it and verify that a stone has actually passed. The stone analysis will often identify what you can do to reduce the incidence of recurrences. °· Only take over-the-counter or prescription medicines for pain, discomfort, or fever as directed by your health care provider. °· Make a follow-up appointment with your health care provider as directed. °· Get follow-up X-rays if required. The absence of pain does not always mean that the stone has passed. It may have only stopped moving. If the urine remains completely obstructed, it can cause loss of kidney function or even complete destruction of the kidney. It is your responsibility to make sure X-rays and follow-ups are completed. Ultrasounds of the kidney can show blockages and the status of the kidney. Ultrasounds are not associated with any radiation and can be performed easily in a matter of minutes. °SEEK MEDICAL CARE IF: °· You experience pain that is progressive and unresponsive to any pain medicine you have been prescribed. °SEEK IMMEDIATE MEDICAL CARE IF:  °· Pain cannot be controlled with the prescribed medicine. °· You have a fever or shaking chills. °· The severity or intensity of pain increases over 18 hours and is not relieved by pain medicine. °· You develop a new onset of abdominal pain. °· You feel faint or pass out. °·   You are unable to urinate. MAKE SURE YOU:   Understand these instructions.  Will watch your condition.  Will get help right away if you are not doing well or get worse. Document Released: 05/07/2005 Document Revised: 01/07/2013 Document Reviewed: 10/08/2012 [facility] Patient Information 2015  [place], [place]. This information is not intended to replace advice given to you by your health care provider. Make sure you discuss any questions you have with your health care provider. Excuse from Work, [employer], or Physical Activity ___Steven Rawls________________________________________ needs to be excused from: __x___ Work _____ [employer] _____ Physical activity Beginning now and through the following date: _____9/6/2016_______________ _____ He/she may return to work or school but still avoid physical activity from now until: ____________________ _____ He/she may return to full physical activity as of: ____________________ Caregiver's signature: ____Patrick McKenzie____________________________________  Date: _________9/1/2016_____________________________________________ Document Released: 10/31/2000 Document Revised: 07/30/2011 Document Reviewed: 05/07/2005 [employer] Patient Information 2015 [place], [place]. This information is not intended to replace advice given to you by your health care provider. Make sure you discuss any questions you have with your health care provider.

## 2015-01-20 NOTE — Progress Notes (Signed)
Easily aroused  Feels like needs to void

## 2015-01-20 NOTE — Transfer of Care (Signed)
Immediate Anesthesia Transfer of Care Note  Patient: Nicolas Shaw  Procedure(s) Performed: Procedure(s): CYSTOSCOPY WITH STENT PLACEMENT (Right)  Patient Location: PACU  Anesthesia Type:General  Level of Consciousness: sedated  Airway & Oxygen Therapy: Patient Spontanous Breathing  Post-op Assessment: Report given to RN and Post -op Vital signs reviewed and stable  Post vital signs: stable  Last Vitals:  Filed Vitals:   01/20/15 1425  BP: 116/65  Pulse: 44  Temp: 36.6 C  Resp: 14    Complications: No apparent anesthesia complications

## 2015-01-20 NOTE — ED Notes (Signed)
Pt resting. Pain seems to be more under control. When asked, pt states pain is 36/10. This is down from 50/10 earlier.

## 2015-01-20 NOTE — ED Notes (Signed)
Resumed care from [place]. Pt returned from Xray. As this nurse was administering dilaudid, pt informed that he has used cocaine in last 12 hours. Pt is restless and fidgety, and states that the pain is not going away.

## 2015-01-20 NOTE — ED Provider Notes (Signed)
-----------------------------------------   7:44 AM on 01/20/2015 -----------------------------------------  Patient reports he is still in moderate pain, x-ray does not clearly demonstrate right-sided nephrolithiasis as etiology. Discussed with the patient the risks and benefits of CT imaging, including the slight increased risk of cancer from radiation associated. After discussion, we will obtain CT imaging to further evaluate the possibility of kidney stone as a cause of his pain or further evaluate for other etiologies given his ongoing discomfort.  In addition, it is noted that the patient discussed with the nurse that he has recently used cocaine. I have added on a drug screen, should the patient have cocaine in his system that I would feel very hesitant to prescribe him any narcotic type pain medications if discharged.  CT shows:  1. 9 by 5 by 4 mm mildly obstructive right proximal ureteral calculus with associated mild right hydronephrosis. 2. There is also a 2 mm nonobstructive left kidney upper pole calculus. 3. 1.9 cm hypodense lesion of the right kidney upper pole has faint peripheral calcifications. Although statistically most likely to be a complex cyst, a solid mass is not entirely excluded and a Bosniak classification cannot be assigned due to lack of IV contrast. Definitive workup by renal protocol MRI with and without contrast should be considered; alternatives might include renal protocol CT with and without contrast or ultrasound.   Discussed with Mckenzie of Urology, consult placed due to ongoing pain control and size of stone with assoc hydro. Dr. Ronne Binning advises ok to give toradol.   ----------------------------------------- 10:16 AM on 01/20/2015 -----------------------------------------  Patient plan to go to the operating room with urology. Admitting to Dr. Thea Silversmith. Patient has been seen and evaluated by his nurse practitioner.  Sharyn Creamer, MD 01/20/15  1016

## 2015-01-20 NOTE — Op Note (Signed)
.  Preoperative diagnosis: right UPJ stone  Postoperative diagnosis: Same  Procedure: 1 cystoscopy 2. right retrograde pyelography 3.  Intraoperative fluoroscopy, under one hour, with interpretation 4. right 6 x 26 JJ stent placement  Attending: Wilkie Aye  Anesthesia: General  Estimated blood loss: None  Drains: Right 6 x 26 JJ ureteral stent without tether  Specimens: none  Antibiotics: ancef  Findings: right UPJ stone. Moderate hydronephrosis. No masses/lesions in the bladder. Ureteral orifices in normal anatomic location.  Indications: Patient is a 29 year old male with a history of right UPJ stone and uncontrolled pain.  After discussing treatment options, they decided proceed with right stent placement.  Procedure her in detail: The patient was brought to the operating room and a brief timeout was done to ensure correct patient, correct procedure, correct site.  General anesthesia was administered patient was placed in dorsal lithotomy position.  Their genitalia was then prepped and draped in usual sterile fashion.  A rigid 22 French cystoscope was passed in the urethra and the bladder.  Bladder was inspected free masses or lesions.  the ureteral orifices were in the normal orthotopic locations.  a 6 french ureteral catheter was then instilled into the right ureteral orifice.  a gentle retrograde was obtained and findings noted above.  we then placed a zip wire through the ureteral catheter and advanced up to the renal pelvis.    We then placed a 6 x 26 double-j ureteral stent over the original zip wire.  We then removed the wire and good coil was noted in the the renal pelvis under fluoroscopy and the bladder under direct vision.  the bladder was then drained and this concluded the procedure which was well tolerated by patient.  Complications: None  Condition: Stable, extubated, transferred to PACU  Plan: Patient is to be admitted for IV antibiotics. He will have his stone  extraction in 2 weeks.

## 2015-01-20 NOTE — ED Notes (Signed)
Patient ambulatory to triage with steady gait, appears uncomfortable; st right lower abd/side pain x 30-96min; st seen at [facility] 3wks ago for abd pain and hematuria and dx with "small kidney stone"; was told to f/u with urology but had no transportation or money

## 2015-01-20 NOTE — Anesthesia Procedure Notes (Signed)
Procedure Name: Intubation Date/Time: 01/20/2015 1:50 PM Performed by: Irving Burton Pre-anesthesia Checklist: Patient identified, Emergency Drugs available, Suction available and Patient being monitored Patient Re-evaluated:Patient Re-evaluated prior to inductionOxygen Delivery Method: Circle system utilized Preoxygenation: Pre-oxygenation with 100% oxygen Intubation Type: IV induction Ventilation: Mask ventilation without difficulty Laryngoscope Size: Miller and 2 Grade View: Grade I Tube type: Oral Tube size: 7.5 mm Number of attempts: 2 Airway Equipment and Method: Patient positioned with wedge pillow and Stylet Placement Confirmation: ETT inserted through vocal cords under direct vision,  positive ETCO2 and breath sounds checked- equal and bilateral Secured at: 23 cm Tube secured with: Tape Dental Injury: Teeth and Oropharynx as per pre-operative assessment  Difficulty Due To: Difficult Airway- due to anterior larynx

## 2015-01-20 NOTE — Progress Notes (Signed)
Dr Noralyn Pick in to see pt several times heart rate not a concern at present  Heart rate 43 to 50's   Higher when awake

## 2015-01-21 NOTE — Care Management (Addendum)
Spoke with the patient and his mother Nicolas Shaw at the bedside. Patient is self payer and needs help with medications and also with money for co pay of follow up appointment with urology. Mother stated that she would be able to pay for co pay on follow up appointment. Applications given for [facility] and also [facility] clinic. [facility] is closed early today. Gave coupon for percocet and valium with [employer] for a total of $35 co pay and signed patient up for [facility] program . Explained in detail how to use resoures. Mother thanked me for my assistance. Told her to call me if any difficulty. Gave contact info.

## 2015-01-21 NOTE — Progress Notes (Signed)
Pharmacy Consult for Antibiotic Renal adjustment  Patient currently has no Antibiotics ordered. Paged Dr. Ronne Binning to address. Per MD no new orders received.  Bari Mantis PharmD Clinical Pharmacist 01/21/2015 8:25 AM

## 2015-01-21 NOTE — Progress Notes (Signed)
Alert and oriented. VSS. No signs of cute distress. Discharge instructions given to patient regarding follow-up appointments and the importance of it.  Patient verbalized understanding. Voiding. Pain under control.

## 2015-01-25 NOTE — Discharge Summary (Signed)
Physician Discharge Summary  Patient ID: Nicolas Shaw MRN: [id number] DOB/AGE: Jul 22, 1985 29 y.o.  Admit date: 01/20/2015 Discharge date: 01/21/2015 Admission Diagnoses: nephrolithiasis  Discharge Diagnoses:  Active Problems:   Nephrolithiasis   Discharged Condition: good  Hospital Course: The patient tolerated the procedure well. He was admitted post op because he had used cocaine the morning prior to his surgery. He was admitted for cardiac monitoring. He he no issues overnight and was discharged home the next day  Consults: None  Significant Diagnostic Studies: none  Treatments: surgery: right ureteral stent placement  Discharge Exam: Blood pressure 126/68, pulse 57, temperature 97.5 F (36.4 C), temperature source Oral, resp. rate 16, height  (1.854 m), weight 92.987 kg (205 lb), SpO2 100 %. General appearance: alert, cooperative and appears stated age Head: Normocephalic, without obvious abnormality, atraumatic Eyes: conjunctivae/corneas clear. PERRL, EOM's intact. Fundi benign. Nose: Nares normal. Septum midline. Mucosa normal. No drainage or sinus tenderness. GI: soft, non-tender; bowel sounds normal; no masses,  no organomegaly Extremities: extremities normal, atraumatic, no cyanosis or edema Skin: Skin color, texture, turgor normal. No rashes or lesions Neurologic: Grossly normal  Disposition: 01-Home or Self Care  Discharge Instructions    Discharge patient    Complete by:  As directed   Home with self care     Discharge patient    Complete by:  As directed   Home with self care            Medication List    TAKE these medications        diazepam 5 MG tablet  Commonly known as:  VALIUM  Take 1 tablet (5 mg total) by mouth every 8 (eight) hours as needed for muscle spasms.     oxybutynin 5 MG tablet  Commonly known as:  DITROPAN  Take 1 tablet (5 mg total) by mouth 3 (three) times daily.     oxyCODONE-acetaminophen 10-325 MG per tablet  Commonly  known as:  PERCOCET  Take 1 tablet by mouth every 4 (four) hours as needed for pain.     tamsulosin 0.4 MG Caps capsule  Commonly known as:  FLOMAX  Take 1 capsule (0.4 mg total) by mouth daily after supper.           Follow-up Information    Follow up with Malen Gauze, MD. Go on 01/27/2015.   Specialty:  Urology   Why:  You have a follow up with Dr. Ronne Binning 01/27/15 at 10:00am in the [place] office. Please bring 250.00 for your for your payment.   Contact information:   [address] [place] [address] [phone]       Signed: Malen Gauze 01/25/2015, 10:41 PM

## 2015-01-25 NOTE — Anesthesia Postprocedure Evaluation (Signed)
  Anesthesia Post-op Note  Patient: Nicolas Shaw  Procedure(s) Performed: Procedure(s): CYSTOSCOPY WITH STENT PLACEMENT (Right)  Anesthesia type:General  Patient location: PACU  Post pain: Pain level controlled  Post assessment: Post-op Vital signs reviewed, Patient's Cardiovascular Status Stable, Respiratory Function Stable, Patent Airway and No signs of Nausea or vomiting  Post vital signs: Reviewed and stable  Last Vitals:  Filed Vitals:   01/21/15 0741  BP: 126/68  Pulse: 57  Temp: 36.4 C  Resp: 16    Level of consciousness: awake, alert  and patient cooperative  Complications: No apparent anesthesia complications

## 2015-01-31 ENCOUNTER — Telehealth: Payer: Self-pay

## 2015-01-31 NOTE — Telephone Encounter (Signed)
Pt called requesting more pain medication. Pt has surgery 02/23/15 for kidney stones. Per Carollee Herter pt needs an appt before anymore pain medication can be prescribed.

## 2015-02-02 ENCOUNTER — Telehealth: Payer: Self-pay | Admitting: Radiology

## 2015-02-02 ENCOUNTER — Encounter: Payer: Self-pay | Admitting: Urology

## 2015-02-02 ENCOUNTER — Ambulatory Visit (INDEPENDENT_AMBULATORY_CARE_PROVIDER_SITE_OTHER): Payer: Self-pay | Admitting: Urology

## 2015-02-02 VITALS — BP 138/88 | HR 102 | Ht 73.0 in | Wt 203.1 lb

## 2015-02-02 DIAGNOSIS — N1330 Unspecified hydronephrosis: Secondary | ICD-10-CM

## 2015-02-02 DIAGNOSIS — N2889 Other specified disorders of kidney and ureter: Secondary | ICD-10-CM

## 2015-02-02 DIAGNOSIS — R109 Unspecified abdominal pain: Secondary | ICD-10-CM

## 2015-02-02 DIAGNOSIS — N201 Calculus of ureter: Secondary | ICD-10-CM

## 2015-02-02 MED ORDER — DIAZEPAM 5 MG PO TABS: 5.0000 mg | ORAL_TABLET | Freq: Three times a day (TID) | ORAL | Status: DC | PRN

## 2015-02-02 MED ORDER — OXYBUTYNIN CHLORIDE 5 MG PO TABS
5.0000 mg | ORAL_TABLET | Freq: Three times a day (TID) | ORAL | Status: DC
Start: 2015-02-02 — End: 2015-07-07

## 2015-02-02 MED ORDER — OXYCODONE-ACETAMINOPHEN 10-325 MG PO TABS: 1.0000 | ORAL_TABLET | ORAL | Status: DC | PRN

## 2015-02-02 NOTE — Telephone Encounter (Signed)
Pt notified of change of surgery date to 02/07/15 and Pre-Admit testing phone interview to 02/03/15. Also gave pt phone number to call to get arrival time for surgery. Pt voiced understanding.

## 2015-02-02 NOTE — Telephone Encounter (Signed)
Pt notified of Surgery date of 02/23/15 and Preop appt with Earlie Lou on 02/09/15 followed by Pre-Admit appt on same day. Pt voiced understanding.

## 2015-02-02 NOTE — Progress Notes (Signed)
02/02/2015 1:57 PM   Nicolas Shaw 07-Mar-1986 [id number]  Referring provider: No referring provider defined for this encounter.  Chief Complaint  Patient presents with  . Nephrolithiasis    New Patient    HPI: Patient is a 29 year old African-American male who presented to the emergency room on 01/20/2015 complaining of intense right-sided flank pain. Patient was seen at [facility] emergency room 3 weeks prior to his [facility] to [facility] for similar complaints along with gross hematuria. He states he had a CT scan performed at that time this was diagnosed with small kidney stone but he was unable to follow-up with urology due to transportation financial issues.   A CT scan without contrast was obtained in the emergency room and he was found to have a 10 mm calculus in the right proximal ureter associated with right hydronephrosis and hydroureter. He was also found to have a 1.9 cm hypodense lesion in the right upper pole of the kidney. Patient underwent emergent right ureteral stent placement that day and he was admitted overnight for observation due to his cocaine use and concern of cardiac issues.  Patient was then discharged to home and he is currently scheduled for right URS/LL/right ureteral stent exchange on 02/21/2015 with our office.  Patient was discharged with diazepam 5 mg tablets, oxybutynin 5 mg tablets, tamsulosin 0.4 mg capsules and oxycodone/acetaminophen 10/325.  He presents today requesting refills of his pain medications and possibly rescheduling his surgery to an earlier time.  Today,  still experiencing intense right-sided flank pain. He is also experiencing stent discomfort with urinary urgency, dysuria, intermittency, hesitancy, straining to urinate, weak urinary stream and frequency. He is also experiencing gross hematuria which is concerning to him.  PMH: Past Medical History  Diagnosis Date  . Asthma   . Depression   . History of  stomach ulcers   . History of kidney stones   . Drug abuse, cocaine type     + UDS ON 01-20-15  . Complication of anesthesia     HARD TO WAKE UP AFTER CYSTO 01-20-15    Surgical History: Past Surgical History  Procedure Laterality Date  . Cystoscopy with stent placement Right 01/20/2015    Procedure: CYSTOSCOPY WITH STENT PLACEMENT;  Surgeon: Malen Gauze, MD;  Location: [facility] ORS;  Service: Urology;  Laterality: Right;    Home Medications:    Medication List       This list is accurate as of: 02/02/15 11:59 PM.  Always use your most recent med list.               diazepam 5 MG tablet  Commonly known as:  VALIUM  Take 1 tablet (5 mg total) by mouth every 8 (eight) hours as needed for muscle spasms.     oxybutynin 5 MG tablet  Commonly known as:  DITROPAN  Take 1 tablet (5 mg total) by mouth 3 (three) times daily.     oxyCODONE-acetaminophen 10-325 MG per tablet  Commonly known as:  PERCOCET  Take 1 tablet by mouth every 4 (four) hours as needed for pain.     tamsulosin 0.4 MG Caps capsule  Commonly known as:  FLOMAX  Take 1 capsule (0.4 mg total) by mouth daily after supper.        Allergies:  Allergies  Allergen Reactions  . Other Swelling    Pt reports allergic to ALL fruits except peaches and watermelon.  . Silver Swelling    Tongue and  throat    Family History: Family History  Problem Relation Age of Onset  . Prostate cancer Father   . Kidney disease Maternal Grandmother     kidney cancer  . Breast cancer Maternal Grandmother   . Breast cancer      aunt  . Diabetes Maternal Grandmother   . Gout Maternal Grandmother   . Gout      aunt    Social History:  reports that he has been smoking Cigarettes.  He has been smoking about 0.30 packs per day. He does not have any smokeless tobacco history on file. He reports that he drinks alcohol. He reports that he uses illicit drugs ("Crack" cocaine).  ROS: UROLOGY Frequent Urination?: Yes Hard to  postpone urination?: No Burning/pain with urination?: Yes Get up at night to urinate?: No Leakage of urine?: No Urine stream starts and stops?: Yes Trouble starting stream?: Yes Do you have to strain to urinate?: Yes Blood in urine?: Yes Urinary tract infection?: No Sexually transmitted disease?: No Injury to kidneys or bladder?: No Painful intercourse?: No Weak stream?: Yes Erection problems?: No Penile pain?: No  Gastrointestinal Nausea?: Yes Vomiting?: No Indigestion/heartburn?: No Diarrhea?: Yes Constipation?: Yes  Constitutional Fever: No Night sweats?: No Weight loss?: No Fatigue?: No  Skin Skin rash/lesions?: No Itching?: No  Eyes Blurred vision?: No Double vision?: No  Ears/Nose/Throat Sore throat?: No Sinus problems?: No  Hematologic/Lymphatic Swollen glands?: No Easy bruising?: No  Cardiovascular Leg swelling?: No Chest pain?: Yes  Respiratory Cough?: No Shortness of breath?: No  Endocrine Excessive thirst?: Yes  Musculoskeletal Back pain?: Yes Joint pain?: Yes  Neurological Headaches?: Yes Dizziness?: No  Psychologic Depression?: No Anxiety?: No  Physical Exam: BP 138/88 mmHg  Pulse 102  Ht 6\' 1"  (1.854 m)  Wt 203 lb 1.6 oz (92.126 kg)  BMI 26.80 kg/m2  Constitutional:  Alert and oriented, No acute distress. HEENT:  AT, moist mucus membranes.  Trachea midline, no masses. Cardiovascular: No clubbing, cyanosis, or edema. Respiratory: Normal respiratory effort, no increased work of breathing. GI: Abdomen is soft, nontender, nondistended, no abdominal masses GU: Right CVA tenderness.  Skin: No rashes, bruises or suspicious lesions. Lymph: No cervical or inguinal adenopathy. Neurologic: Grossly intact, no focal deficits, moving all 4 extremities. Psychiatric: Normal mood and affect.  Laboratory Data: Lab Results  Component Value Date   WBC 12.3* 01/20/2015   HGB 16.7 01/20/2015   HCT 50.8 01/20/2015   MCV 87.5  01/20/2015   PLT 213 01/20/2015   Lab Results  Component Value Date   CREATININE 1.13 01/20/2015   Urinalysis    Component Value Date/Time   COLORURINE AMBER* 01/20/2015 1110   COLORURINE Yellow 08/02/2012 1709   APPEARANCEUR HAZY* 01/20/2015 1110   APPEARANCEUR Cloudy 08/02/2012 1709   LABSPEC 1.027 01/20/2015 1110   LABSPEC 1.021 08/02/2012 1709   PHURINE 6.0 01/20/2015 1110   PHURINE 7.0 08/02/2012 1709   GLUCOSEU NEGATIVE 01/20/2015 1110   GLUCOSEU Negative 08/02/2012 1709   HGBUR 3+* 01/20/2015 1110   HGBUR Negative 08/02/2012 1709   BILIRUBINUR NEGATIVE 01/20/2015 1110   BILIRUBINUR Negative 08/02/2012 1709   KETONESUR 1+* 01/20/2015 1110   KETONESUR Negative 08/02/2012 1709   PROTEINUR 100* 01/20/2015 1110   PROTEINUR Negative 08/02/2012 1709   NITRITE NEGATIVE 01/20/2015 1110   NITRITE Negative 08/02/2012 1709   LEUKOCYTESUR NEGATIVE 01/20/2015 1110   LEUKOCYTESUR Trace 08/02/2012 1709    Pertinent Imaging: CLINICAL DATA: Right lower abdominal pain and flank pain for 45 minutes. Prior  hematuria and renal calculus.  EXAM: CT ABDOMEN AND PELVIS WITHOUT CONTRAST  TECHNIQUE: Multidetector CT imaging of the abdomen and pelvis was performed following the standard protocol without IV contrast.  COMPARISON: 01/20/2015  FINDINGS: Lower chest: Unremarkable  Hepatobiliary: Unremarkable  Pancreas: Unremarkable  Spleen: Unremarkable  Adrenals/Urinary Tract: 9 by 5 by 4 mm calculus in the right proximal ureter associated with mild right hydronephrosis and hydroureter proximal to this stone.  1-2 mm left kidney upper pole nonobstructive calculus. Hypodense lesion in the right kidney upper pole measuring 1.9 cm in diameter, with very faint calcifications along its margin.  Stomach/Bowel: Unremarkable  Vascular/Lymphatic: Unremarkable  Reproductive: Unremarkable  Other: No supplemental non-categorized findings.  Musculoskeletal:  Unremarkable  IMPRESSION: 1. 9 by 5 by 4 mm mildly obstructive right proximal ureteral calculus with associated mild right hydronephrosis. 2. There is also a 2 mm nonobstructive left kidney upper pole calculus. 3. 1.9 cm hypodense lesion of the right kidney upper pole has faint peripheral calcifications. Although statistically most likely to be a complex cyst, a solid mass is not entirely excluded and a Bosniak classification cannot be assigned due to lack of IV contrast. Definitive workup by renal protocol MRI with and without contrast should be considered; alternatives might include renal protocol CT with and without contrast or ultrasound.   Electronically Signed  By: Gaylyn Rong M.D.  On: 01/20/2015 08:14   Assessment & Plan:    1. Right proximal ureteral calculus:    Patient is status post right emergent ureteral stent placement on 01/20/2015 for 10 mm right proximal ureteral stone.   He is currently scheduled for right URS/LL/right ureteral stent exchange on 02/21/2015. We have removed at the time of this procedure to Monday September 19th with Dr. Mena Goes.  Dr. Mena Goes is notified and the case is discussed with him and he agrees with the plan.  I explained to the patient how the procedure is performed and the risks involved.    I reassured the patient that the hematuria and urinary symptoms he is experiencing is caused by the ureteral stent.  He will have a new ureteral stent placed after his procedure to remove the stone.  It will be removed in the office with a cystoscope, unless a string in left in place.  I reminded him that he will still experience  "stent pain" after the procedure and this is by far the most common risk/complaint following ureteroscopy. A stent is a soft plastic tube (about half the size of IV tubing) that allows the kidney to drain to the bladder regardless of edema or obstruction. Not only can the stent "rub" on the inside of the bladder,  causing a feeling of needing to urinate/overactive bladder, but also the stent allows urine to pass up from the bladder to the kidney during urination - causing symptoms from a warm, tingling sensation to intense pain in the affected flank.   They may be residual stones within the kidney or ureter may be present up to 40% of the time following ureteroscopy, depending on the original stone size and location. These stone fragments will be seen and addressed on follow-up imaging.  Injury to the ureter is the most common intra-operative complication during ureteroscopy. The reported risk of perforation ranges greatly, depending on whether it is defined as a complete perforation (0.1-0.7% - think of this as a hole through the entire ureter), a partial perforation (1.6% - a hole nearly through the entire ureter), or mucosal tear/scrape (5% - these are  similar to a sore on the inside of the mouth). Almost 100% of these will heal with prolonged stenting (anywhere between 2 - 4 weeks). Should a large perforation occur, your urologist may chose to stop the procedure and return on another day when the ureter has had time to heal.   I also reminded him of  the risks of general anesthesia, such as: MI, CVA, paralysis, coma and/or death.  2. Right hydronephrosis:   Patient will undergo imaging studies after his definitive treatment for the right proximal stone to ensure the hydronephrosis has resolved.  3. Right renal mass:   A 1.9 cm hypodense lesion was found in the upper pole of the right kidney that has faint peripheral calcifications. A Bosniak classification could not be assigned due to the lack of IV contrast.  Patient will undergo further imaging studies with contrast after his right proximal ureteral stone is addressed.  4. Right flank pain:   Patient is experiencing right flank pain and stent discomfort at this time. Patient is given new prescriptions for his diazepam 5 mg (#30), oxybutynin 5 mg (#60) and   Percocet 10/325 mg (#30).  Patient stated he did not need a refill for the tamsulosin 0.4 mg at this time.  It is explained to the patient that these prescriptions should carry him through his surgery and his postoperative period.         There are no diagnoses linked to this encounter.  No Follow-up on file.  Michiel Cowboy, PA-C  [facility] [address], [address] [place], [place] [address] [phone]

## 2015-02-03 ENCOUNTER — Other Ambulatory Visit: Payer: Self-pay

## 2015-02-03 ENCOUNTER — Encounter: Payer: Self-pay | Admitting: *Deleted

## 2015-02-03 NOTE — Patient Instructions (Addendum)
  Your procedure is scheduled on: 02-07-15 Report to [facility] SAME [facility] 2ND FLOOR To find out your arrival time please call [phone] between 1PM - 3PM on 02-04-15  Remember: Instructions that are not followed completely may result in serious medical risk, up to and including death, or upon the discretion of your surgeon and anesthesiologist your surgery may need to be rescheduled.    _X___ 1. Do not eat food or drink liquids after midnight. No gum chewing or hard candies.     _X___ 2. No Alcohol for 24 hours before or after surgery.   ____ 3. Bring all medications with you on the day of surgery if instructed.    ____ 4. Notify your doctor if there is any change in your medical condition     (cold, fever, infections).     Do not wear jewelry, make-up, hairpins, clips or nail polish.  Do not wear lotions, powders, or perfumes. You may wear deodorant.  Do not shave 48 hours prior to surgery. Men may shave face and neck.  Do not bring valuables to the hospital.    [facility] is not responsible for any belongings or valuables.               Contacts, dentures or bridgework may not be worn into surgery.  Leave your suitcase in the car. After surgery it may be brought to your room.  For patients admitted to the hospital, discharge time is determined by your treatment team.   Patients discharged the day of surgery will not be allowed to drive home.   Please read over the following fact sheets that you were given:      ____ Take these medicines the morning of surgery with A SIP OF WATER:    1. OXYBUTININ             2. MAY TAKE PERCOCET IF NEEDED  3..   4.  5.  6.  ____ Fleet Enema (as directed)   ____ Use CHG Soap as directed  ____ Use inhalers on the day of surgery  ____ Stop metformin 2 days prior to surgery    ____ Take 1/2 of usual insulin dose the night before surgery and none on the morning of surgery.   ____ Stop Coumadin/Plavix/aspirin-N/A  ____ Stop  Anti-inflammatories -NO NSAIDS OR ASA PRODUCTS-TYLENOL OK   ____ Stop supplements until after surgery.    ____ Bring C-Pap to the hospital.

## 2015-02-04 LAB — CULTURE, URINE COMPREHENSIVE

## 2015-02-05 DIAGNOSIS — N2889 Other specified disorders of kidney and ureter: Secondary | ICD-10-CM | POA: Insufficient documentation

## 2015-02-05 DIAGNOSIS — R109 Unspecified abdominal pain: Secondary | ICD-10-CM | POA: Insufficient documentation

## 2015-02-05 DIAGNOSIS — N1330 Unspecified hydronephrosis: Secondary | ICD-10-CM | POA: Insufficient documentation

## 2015-02-07 ENCOUNTER — Encounter: Payer: Self-pay | Admitting: *Deleted

## 2015-02-07 ENCOUNTER — Telehealth: Payer: Self-pay | Admitting: Urology

## 2015-02-07 ENCOUNTER — Encounter
Admission: RE | Disposition: A | Discharge status: Home / Self Care | Payer: Self-pay | Source: Ambulatory Visit | Attending: Urology

## 2015-02-07 ENCOUNTER — Ambulatory Visit
Admission: RE | Admit: 2015-02-07 | Discharge: 2015-02-07 | Disposition: A | Discharge status: Home / Self Care | Payer: Self-pay | Source: Ambulatory Visit | Attending: Urology | Admitting: Urology

## 2015-02-07 DIAGNOSIS — R109 Unspecified abdominal pain: Secondary | ICD-10-CM

## 2015-02-07 DIAGNOSIS — Z5309 Procedure and treatment not carried out because of other contraindication: Secondary | ICD-10-CM | POA: Insufficient documentation

## 2015-02-07 HISTORY — DX: Other complications of anesthesia, initial encounter: T88.59XA

## 2015-02-07 HISTORY — DX: Cocaine abuse, uncomplicated: F14.10

## 2015-02-07 HISTORY — DX: Adverse effect of unspecified anesthetic, initial encounter: T41.45XA

## 2015-02-07 SURGERY — URETEROSCOPY, WITH LITHOTRIPSY USING HOLMIUM LASER
Anesthesia: Choice | Laterality: Right

## 2015-02-07 MED ORDER — SODIUM CHLORIDE 0.9 % IV SOLN
2.0000 g | Freq: Once | INTRAVENOUS | Status: DC
  Filled 2015-02-07: qty 2000

## 2015-02-07 MED ORDER — FAMOTIDINE 20 MG PO TABS: 20.0000 mg | ORAL_TABLET | Freq: Once | ORAL | Status: DC

## 2015-02-07 MED ORDER — LACTATED RINGERS IV SOLN: INTRAVENOUS | Status: DC

## 2015-02-07 MED ORDER — OXYCODONE-ACETAMINOPHEN 10-325 MG PO TABS: 1.0000 | ORAL_TABLET | ORAL | Status: DC | PRN

## 2015-02-07 MED ORDER — FAMOTIDINE 20 MG PO TABS
ORAL_TABLET | ORAL | Status: AC
  Filled 2015-02-07: qty 1

## 2015-02-07 MED ORDER — GENTAMICIN SULFATE 40 MG/ML IJ SOLN
80.0000 mg | Freq: Once | INTRAVENOUS | Status: DC
  Filled 2015-02-07: qty 2

## 2015-02-07 SURGICAL SUPPLY — 30 items
BAG DRAIN CYSTO-URO LG1000N (MISCELLANEOUS) IMPLANT
BASKET ZERO TIP 1.9FR (BASKET) IMPLANT
CATH FOL LX CONE TIP  8F (CATHETERS)
CATH FOL LX CONE TIP 8F (CATHETERS) IMPLANT
CATH URETL 5X70 OPEN END (CATHETERS) IMPLANT
CATH URETL OPEN END 6X70 (CATHETERS) IMPLANT
CONRAY 43 FOR UROLOGY 50M (MISCELLANEOUS) IMPLANT
FEE TECHNICIAN ONLY PER HOUR (MISCELLANEOUS) IMPLANT
GLOVE BIO SURGEON STRL SZ7 (GLOVE) IMPLANT
GLOVE BIO SURGEON STRL SZ7.5 (GLOVE) IMPLANT
GLOVE BIO SURGEON STRL SZ8 (GLOVE) IMPLANT
GOWN L4 LG 24 PK N/S (GOWN DISPOSABLE) IMPLANT
GOWN STRL REUS W/ TWL LRG LVL3 (GOWN DISPOSABLE) IMPLANT
GOWN STRL REUS W/ TWL XL LVL3 (GOWN DISPOSABLE) IMPLANT
GOWN STRL REUS W/TWL LRG LVL3 (GOWN DISPOSABLE)
GOWN STRL REUS W/TWL XL LVL3 (GOWN DISPOSABLE) IMPLANT
GUIDEWIRE ANG ZIPWIRE 035X150 (WIRE) IMPLANT
GUIDEWIRE STR ZIPWIRE 035X150 (MISCELLANEOUS) IMPLANT
HOLMIUM LASER 550 FIBER (Laser) IMPLANT
INTRODUCER DILATOR DOUBLE (INTRODUCER) IMPLANT
NS IRRIG 500ML POUR BTL (IV SOLUTION) IMPLANT
PACK CYSTO AR (MISCELLANEOUS) IMPLANT
PREP PVP WINGED SPONGE (MISCELLANEOUS) IMPLANT
SENSORWIRE 0.038 NOT ANGLED (WIRE)
SET CYSTO W/LG BORE CLAMP LF (SET/KITS/TRAYS/PACK) IMPLANT
SOL .9 NS 3000ML IRR  AL (IV SOLUTION)
SOL .9 NS 3000ML IRR UROMATIC (IV SOLUTION) IMPLANT
WATER STERILE IRR 1000ML POUR (IV SOLUTION) IMPLANT
WIRE G XSTIFF 025X145 (WIRE) IMPLANT
WIRE SENSOR 0.038 NOT ANGLED (WIRE) IMPLANT

## 2015-02-07 NOTE — Telephone Encounter (Signed)
Spoke with patient and notified him that his surgery was moved to 02-09-15 and gave him the number to call tomorrow between 1-3 to find out what time to arrive on the day of surgery, and also reiterated not to eat or drink from midnight on.

## 2015-02-07 NOTE — OR Nursing (Signed)
Dr. Dimple Casey notified of patient having a chocolate bar at 5:00 am. Dr. Mena Goes into see patient and reports surgery needs to be postponed due to eating chocolate bar. Pt. Was provided a new prescription for pain and discharged via ambulatory.

## 2015-02-07 NOTE — Progress Notes (Signed)
Spoke with anesthesia MD. Pt has had taken po twice since midnight and will need a full 8 hrs NPO. He will need to be rescheduled. I discussed with him the importance of strict NPO in the future and the importance of follow-up as a retained stent can become encrusted, retained and  lead to significant kidney damage. I also discussed with him the nature of complex renal cysts and the importance of follow-up for the MRI.

## 2015-02-07 NOTE — Anesthesia Preprocedure Evaluation (Deleted)
Anesthesia Evaluation    Airway       Dental   Pulmonary Current Smoker,          Cardiovascular     Neuro/Psych    GI/Hepatic   Endo/Other    Renal/GU      Musculoskeletal   Abdominal   Peds  Hematology   Anesthesia Other Findings   Reproductive/Obstetrics                           Anesthesia Physical Anesthesia Plan Anesthesia Quick Evaluation  

## 2015-02-09 ENCOUNTER — Encounter
Admission: RE | Disposition: A | Discharge status: Home / Self Care | Payer: Self-pay | Source: Ambulatory Visit | Attending: Urology

## 2015-02-09 ENCOUNTER — Ambulatory Visit
Admission: RE | Admit: 2015-02-09 | Discharge: 2015-02-09 | Disposition: A | Discharge status: Home / Self Care | Payer: Self-pay | Source: Ambulatory Visit | Attending: Urology | Admitting: Urology

## 2015-02-09 ENCOUNTER — Encounter: Payer: Self-pay | Admitting: *Deleted

## 2015-02-09 ENCOUNTER — Ambulatory Visit: Payer: MEDICAID | Admitting: Anesthesiology

## 2015-02-09 ENCOUNTER — Ambulatory Visit: Payer: Self-pay | Admitting: Anesthesiology

## 2015-02-09 ENCOUNTER — Telehealth: Payer: Self-pay

## 2015-02-09 ENCOUNTER — Ambulatory Visit: Payer: Self-pay | Admitting: Obstetrics and Gynecology

## 2015-02-09 ENCOUNTER — Other Ambulatory Visit: Payer: Self-pay

## 2015-02-09 DIAGNOSIS — F17210 Nicotine dependence, cigarettes, uncomplicated: Secondary | ICD-10-CM | POA: Insufficient documentation

## 2015-02-09 DIAGNOSIS — Z91048 Other nonmedicinal substance allergy status: Secondary | ICD-10-CM | POA: Insufficient documentation

## 2015-02-09 DIAGNOSIS — N132 Hydronephrosis with renal and ureteral calculous obstruction: Secondary | ICD-10-CM | POA: Insufficient documentation

## 2015-02-09 DIAGNOSIS — Z87442 Personal history of urinary calculi: Secondary | ICD-10-CM | POA: Insufficient documentation

## 2015-02-09 DIAGNOSIS — N201 Calculus of ureter: Secondary | ICD-10-CM

## 2015-02-09 DIAGNOSIS — J45909 Unspecified asthma, uncomplicated: Secondary | ICD-10-CM | POA: Insufficient documentation

## 2015-02-09 DIAGNOSIS — F329 Major depressive disorder, single episode, unspecified: Secondary | ICD-10-CM | POA: Insufficient documentation

## 2015-02-09 DIAGNOSIS — R109 Unspecified abdominal pain: Secondary | ICD-10-CM

## 2015-02-09 HISTORY — PX: URETEROSCOPY WITH HOLMIUM LASER LITHOTRIPSY: SHX6645

## 2015-02-09 HISTORY — PX: CYSTOSCOPY WITH STENT PLACEMENT: SHX5790

## 2015-02-09 LAB — BASIC METABOLIC PANEL
ANION GAP: 5 (ref 5–15)
BUN: 14 mg/dL (ref 6–20)
CHLORIDE: 105 mmol/L (ref 101–111)
CO2: 29 mmol/L (ref 22–32)
Calcium: 9.5 mg/dL (ref 8.9–10.3)
Creatinine, Ser: 0.94 mg/dL (ref 0.61–1.24)
GFR calc Af Amer: >60 mL/min (ref >60)
GLUCOSE: 95 mg/dL (ref 65–99)
POTASSIUM: 4.5 mmol/L (ref 3.5–5.1)
SODIUM: 139 mmol/L (ref 135–145)

## 2015-02-09 LAB — CBC
HEMATOCRIT: 49.1 % (ref 40.0–52.0)
HEMOGLOBIN: 16.2 g/dL (ref 13.0–18.0)
MCH: 28.8 pg (ref 26.0–34.0)
MCHC: 33.1 g/dL (ref 32.0–36.0)
MCV: 86.9 fL (ref 80.0–100.0)
Platelets: 235 10*3/uL (ref 150–440)
RBC: 5.65 MIL/uL (ref 4.40–5.90)
RDW: 13 % (ref 11.5–14.5)
WBC: 8.4 10*3/uL (ref 3.8–10.6)

## 2015-02-09 LAB — URINE DRUG SCREEN, QUALITATIVE (ARMC ONLY)
AMPHETAMINES, UR SCREEN: NOT DETECTED
BENZODIAZEPINE, UR SCRN: POSITIVE — AB
Barbiturates, Ur Screen: NOT DETECTED
Cannabinoid 50 Ng, Ur ~~LOC~~: NOT DETECTED
Cocaine Metabolite,Ur ~~LOC~~: NOT DETECTED
MDMA (ECSTASY) UR SCREEN: NOT DETECTED
METHADONE SCREEN, URINE: NOT DETECTED
OPIATE, UR SCREEN: POSITIVE — AB
Phencyclidine (PCP) Ur S: NOT DETECTED
Tricyclic, Ur Screen: NOT DETECTED

## 2015-02-09 SURGERY — URETEROSCOPY, WITH LITHOTRIPSY USING HOLMIUM LASER
Anesthesia: General | Laterality: Right | Wound class: Clean Contaminated

## 2015-02-09 MED ORDER — ROCURONIUM BROMIDE 100 MG/10ML IV SOLN
INTRAVENOUS | Status: DC | PRN
  Administered 2015-02-09: 10 mg via INTRAVENOUS
  Administered 2015-02-09: 30 mg via INTRAVENOUS

## 2015-02-09 MED ORDER — NEOSTIGMINE METHYLSULFATE 10 MG/10ML IV SOLN
INTRAVENOUS | Status: DC | PRN
  Administered 2015-02-09: 3 mg via INTRAVENOUS

## 2015-02-09 MED ORDER — OXYCODONE-ACETAMINOPHEN 5-325 MG PO TABS
ORAL_TABLET | ORAL | Status: AC
  Administered 2015-02-09: 2 via ORAL
  Filled 2015-02-09: qty 2

## 2015-02-09 MED ORDER — LACTATED RINGERS IV SOLN
INTRAVENOUS | Status: DC
  Administered 2015-02-09 (×2): via INTRAVENOUS

## 2015-02-09 MED ORDER — FENTANYL CITRATE (PF) 100 MCG/2ML IJ SOLN
INTRAMUSCULAR | Status: AC
  Administered 2015-02-09: 25 ug via INTRAVENOUS
  Filled 2015-02-09: qty 2

## 2015-02-09 MED ORDER — KETOROLAC TROMETHAMINE 30 MG/ML IJ SOLN
INTRAMUSCULAR | Status: DC | PRN
  Administered 2015-02-09: 30 mg via INTRAVENOUS

## 2015-02-09 MED ORDER — GENTAMICIN IN SALINE 1.6-0.9 MG/ML-% IV SOLN
80.0000 mg | Freq: Once | INTRAVENOUS | Status: AC
  Administered 2015-02-09: 80 mg via INTRAVENOUS
  Filled 2015-02-09: qty 50

## 2015-02-09 MED ORDER — LIDOCAINE HCL (CARDIAC) 20 MG/ML IV SOLN
INTRAVENOUS | Status: DC | PRN
  Administered 2015-02-09: 100 mg via INTRAVENOUS

## 2015-02-09 MED ORDER — FAMOTIDINE 20 MG PO TABS
20.0000 mg | ORAL_TABLET | Freq: Once | ORAL | Status: AC
  Administered 2015-02-09: 20 mg via ORAL

## 2015-02-09 MED ORDER — ONDANSETRON HCL 4 MG/2ML IJ SOLN: 4.0000 mg | Freq: Once | INTRAMUSCULAR | Status: DC | PRN

## 2015-02-09 MED ORDER — FENTANYL CITRATE (PF) 100 MCG/2ML IJ SOLN
INTRAMUSCULAR | Status: DC | PRN
  Administered 2015-02-09: 50 ug via INTRAVENOUS
  Administered 2015-02-09: 100 ug via INTRAVENOUS

## 2015-02-09 MED ORDER — IOTHALAMATE MEGLUMINE 17.2 % UR SOLN
URETHRAL | Status: DC | PRN
  Administered 2015-02-09: 15 mL via URETHRAL

## 2015-02-09 MED ORDER — GENTAMICIN SULFATE 40 MG/ML IJ SOLN: 80.0000 mg | Freq: Once | INTRAVENOUS | Status: DC

## 2015-02-09 MED ORDER — MIDAZOLAM HCL 2 MG/2ML IJ SOLN
INTRAMUSCULAR | Status: DC | PRN
  Administered 2015-02-09: 2 mg via INTRAVENOUS

## 2015-02-09 MED ORDER — OXYCODONE-ACETAMINOPHEN 10-325 MG PO TABS: 1.0000 | ORAL_TABLET | ORAL | Status: DC | PRN

## 2015-02-09 MED ORDER — FAMOTIDINE 20 MG PO TABS
ORAL_TABLET | ORAL | Status: AC
  Filled 2015-02-09: qty 1

## 2015-02-09 MED ORDER — GLYCOPYRROLATE 0.2 MG/ML IJ SOLN
INTRAMUSCULAR | Status: DC | PRN
Start: 2015-02-09 — End: 2015-02-09
  Administered 2015-02-09: 0.4 mg via INTRAVENOUS

## 2015-02-09 MED ORDER — SODIUM CHLORIDE 0.9 % IV SOLN
2.0000 g | Freq: Once | INTRAVENOUS | Status: AC
  Administered 2015-02-09: 2 g via INTRAVENOUS
  Filled 2015-02-09: qty 2000

## 2015-02-09 MED ORDER — PROPOFOL 10 MG/ML IV BOLUS
INTRAVENOUS | Status: DC | PRN
  Administered 2015-02-09: 180 mg via INTRAVENOUS
  Administered 2015-02-09: 20 mg via INTRAVENOUS

## 2015-02-09 MED ORDER — CIPROFLOXACIN HCL 500 MG PO TABS: 500.0000 mg | ORAL_TABLET | Freq: Two times a day (BID) | ORAL | Status: DC

## 2015-02-09 MED ORDER — OXYCODONE-ACETAMINOPHEN 5-325 MG PO TABS
2.0000 | ORAL_TABLET | ORAL | Status: DC | PRN
  Administered 2015-02-09: 2 via ORAL

## 2015-02-09 MED ORDER — ONDANSETRON HCL 4 MG/2ML IJ SOLN
INTRAMUSCULAR | Status: DC | PRN
  Administered 2015-02-09: 4 mg via INTRAVENOUS

## 2015-02-09 MED ORDER — FENTANYL CITRATE (PF) 100 MCG/2ML IJ SOLN
25.0000 ug | INTRAMUSCULAR | Status: DC | PRN
  Administered 2015-02-09 (×2): 25 ug via INTRAVENOUS

## 2015-02-09 SURGICAL SUPPLY — 23 items
ADAPTER SCOPE UROLOK II (MISCELLANEOUS) ×2 IMPLANT
BACTOSHIELD CHG 4% 4OZ (MISCELLANEOUS) ×1
BASKET ZERO TIP 1.9FR (BASKET) ×2 IMPLANT
CATH URETL 5X70 OPEN END (CATHETERS) ×2 IMPLANT
FEE TECHNICIAN ONLY PER HOUR (MISCELLANEOUS) IMPLANT
GLOVE BIO SURGEON STRL SZ7 (GLOVE) ×4 IMPLANT
GLOVE BIO SURGEON STRL SZ7.5 (GLOVE) ×2 IMPLANT
GOWN L4 LG 24 PK N/S (GOWN DISPOSABLE) ×2 IMPLANT
GOWN STRL REUS W/TWL XL LVL3 (GOWN DISPOSABLE) ×2 IMPLANT
GUIDEWIRE ANG ZIPWIRE 035X150 (WIRE) ×2 IMPLANT
KIT RM TURNOVER CYSTO AR (KITS) ×2 IMPLANT
LASER HOLMIUM FIBER SU 272UM (MISCELLANEOUS) IMPLANT
LASER HOLMIUM SU 940UM (MISCELLANEOUS) ×2 IMPLANT
PACK CYSTO AR (MISCELLANEOUS) ×2 IMPLANT
SCRUB CHG 4% DYNA-HEX 4OZ (MISCELLANEOUS) ×1 IMPLANT
SENSORWIRE 0.038 NOT ANGLED (WIRE) ×2
SET CYSTO W/LG BORE CLAMP LF (SET/KITS/TRAYS/PACK) ×2 IMPLANT
SHEATH URETERAL 13/15X36 1L (SHEATH) ×2 IMPLANT
STENT URET 6FRX24 CONTOUR (STENTS) IMPLANT
STENT URET 6FRX26 CONTOUR (STENTS) ×2 IMPLANT
SURGILUBE 2OZ TUBE FLIPTOP (MISCELLANEOUS) ×2 IMPLANT
SYRINGE IRR TOOMEY STRL 70CC (SYRINGE) ×2 IMPLANT
WIRE SENSOR 0.038 NOT ANGLED (WIRE) ×1 IMPLANT

## 2015-02-09 NOTE — Anesthesia Preprocedure Evaluation (Signed)
Anesthesia Evaluation  Patient identified by MRN, date of birth, ID band Patient awake    Reviewed: Allergy & Precautions, NPO status , Patient's Chart, lab work & pertinent test results  History of Anesthesia Complications (+) PROLONGED EMERGENCENegative for: history of anesthetic complications  Airway Mallampati: II  TM Distance: >3 FB     Dental  (+) Teeth Intact   Pulmonary asthma (no problems in the last few years) , Current Smoker,           Cardiovascular negative cardio ROS       Neuro/Psych Depression    GI/Hepatic negative GI ROS, Neg liver ROS,   Endo/Other  negative endocrine ROS  Renal/GU Renal disease     Musculoskeletal   Abdominal   Peds  Hematology negative hematology ROS (+)   Anesthesia Other Findings   Reproductive/Obstetrics                             Anesthesia Physical Anesthesia Plan  ASA: II  Anesthesia Plan: General   Post-op Pain Management:    Induction: Intravenous  Airway Management Planned: Oral ETT  Additional Equipment:   Intra-op Plan:   Post-operative Plan:   Informed Consent: I have reviewed the patients History and Physical, chart, labs and discussed the procedure including the risks, benefits and alternatives for the proposed anesthesia with the patient or authorized representative who has indicated his/her understanding and acceptance.     Plan Discussed with:   Anesthesia Plan Comments:         Anesthesia Quick Evaluation

## 2015-02-09 NOTE — Anesthesia Postprocedure Evaluation (Signed)
  Anesthesia Post-op Note  Patient: Nicolas Shaw  Procedure(s) Performed: Procedure(s): URETEROSCOPY WITH HOLMIUM LASER LITHOTRIPSY (Right) CYSTOSCOPY WITH STENT PLACEMENT (Right)  Anesthesia type:General  Patient location: PACU  Post pain: Pain level controlled  Post assessment: Post-op Vital signs reviewed, Patient's Cardiovascular Status Stable, Respiratory Function Stable, Patent Airway and No signs of Nausea or vomiting  Post vital signs: Reviewed and stable  Last Vitals:  Filed Vitals:   02/09/15 1211  BP: 124/69  Pulse: 67  Temp: 36.8 C  Resp: 16    Level of consciousness: awake, alert  and patient cooperative  Complications: No apparent anesthesia complications

## 2015-02-09 NOTE — Op Note (Signed)
Preoperative diagnosis: Right ureteral stone  Postoperative diagnosis: Right ureteral stone  Procedure: 1. Cystoscopy 2. Right retrograde pyelogram with radiographic interpretation 3. Right ureteroscopy 4. Laser lithotripsy 5. Right ureteral stone basketing 6. Right ureteral stent exchange (6 French X 26 cm)  Surgeon: Baruch Gouty, M.D.  Specimens: Right ureteral stone  Drains: 6 French X 26 cm right double-J ureteral stent  Retrograde pyelogram Findings: filling defect seen in proximal ureter consistent with right ureteral stone. At the end the procedure, there is no more filling defects. Right ureteral stent was successfully exchanged  Disposition: Stable post anesthesia care unit  Indications: The patient is a 29 year old male who has a right ureteral stone status post right ureteral stent placement. He presents today for definitive stone management.  Description of procedure: The patient was met in the preoperative area all risks, benefits, and indications for the procedure were described in great detail. The patient consented to the procedure. Preoperative antibiotics were given. The patient was taken back to the operative theater. Gen. anesthesia was induced per anesthesia service. The patient was then placed in dorsal lithotomy and prepped and draped in usual sterile fashion. A timeout was called. A 22 French 30 lens cystoscope was inserted into the patient's bladder per meatus atraumatically. The right ureteral stent was then grasped flexible graspers and brought to level the urethral meatus. A sensor wire was exchanged over through the stent under fluoroscopy. The semirigid ureteroscope was then inserted into the patient's meatus and the right ureter was intubated with some difficulty. The ureteroscope was then driven to the right ureteral stone in the proximal ureter. The stone was then dusted with the laser except for one fragment. This was grasped with stone basket and removed  from the ureter and dropped in the bladder. The right ureteroscope was then placed back in the right ureter with some difficulty.  The decision was made at this time to use a flexible ureteroscope to limit trauma to the ureter. A second Glidewire was placed under fluoroscopy through through the semirigid ureteroscope. The ureteroscope was then withdrawn. The flexible ureteroscope was then placed over the Glidewire, and then the Glidewire was removed. Pan nephroscopy then took place showing no stone fragments. The right ureter was then visualized in its entirety showing no more stone fragments. The flexible ureteroscope was then withdrawn. The cystoscope was reinserted over the sensor wire and a 6 [place] by 26 cm double-J renal stent was placed and the sensor wire removed. Correct placement was confirmed with the curl seen with fluoroscopy patient's right collecting system. Confirmation of the correct distal placement was seen with direct visualization of the distal ureteral stent in the bladder. Stone fragment was then located and irrigated out with some blood clot. Stone was sent to pathology. The patient's bladder was then drained. The patient was then woken from anesthesia and transferred to position the positive care unit.  Plan: The patient will follow-up in one week's time for a stent pole on the right side. He will then need a ultrasound in 1 month's time.

## 2015-02-09 NOTE — Discharge Instructions (Signed)

## 2015-02-09 NOTE — Brief Op Note (Signed)
02/09/2015  1:56 PM  PATIENT:  Nicolas Shaw  29 y.o. male  PRE-OPERATIVE DIAGNOSIS:  RIGHT URETERAL STONE  POST-OPERATIVE DIAGNOSIS:  RIGHT URETERAL STONE  PROCEDURE:  Procedure(s): URETEROSCOPY WITH HOLMIUM LASER LITHOTRIPSY (Right) CYSTOSCOPY WITH STENT PLACEMENT (Right)  SURGEON:  Surgeon(s) and Role:    * Hildred Laser, MD - Primary  PHYSICIAN ASSISTANT:   ASSISTANTS: none   ANESTHESIA:   general  EBL:  Total I/O In: 600 [I.V.:600] Out: -   BLOOD ADMINISTERED:none  DRAINS: 6 fr x 26 cm double j right ureteral stent   LOCAL MEDICATIONS USED:  NONE  SPECIMEN:  Source of Specimen:  right ureter  DISPOSITION OF SPECIMEN:  PATHOLOGY  TOURNIQUET:  * No tourniquets in log *  DICTATION: .Dragon Dictation  PLAN OF CARE: Discharge to home after PACU  PATIENT DISPOSITION:  PACU - hemodynamically stable.   Delay start of Pharmacological VTE agent (>24hrs) due to surgical blood loss or risk of bleeding: no

## 2015-02-09 NOTE — Telephone Encounter (Signed)
-----   Message from Hildred Laserzyn, MD sent at 02/09/2015  2:12 PM EDT ----- Mr. Fung needs to follow up in one week for stent removal with office cysto.  Thanks

## 2015-02-09 NOTE — H&P (View-Only) (Signed)
Urology Consult  I have been asked to see the patient by Dr. Fanny Bien, for evaluation and management of  9 x 5 x 4 mm calculus in the right proximal ureter associated with mild right hydronephrosis and hydroureter proximal to this stone.    Chief Complaint: Intractable right flank pain   History of Present Illness: Nicolas Shaw is a 29 y.o. year old African-American male who presented to the emergency room earlier today complaining of an intractable right sided flank pain.  The patient's mother is present with him in the room. I have obtained the history of this patient from his mother due to patient's extreme lethargy caused by pain medication.  Patient's mother states that earlier in the evening yesterday he started complaining of a right sided pain. He described it as a nagging pain at that time and continued to work. As the evening progressed, the pain increased and became so intense that after his shift his mother brought him to the emergency room.  She states that she did not know if he had fevers or chills, but he had nausea and some vomiting.  She is unsure if he has had gross hematuria, dysuria or urinary urgency.  She denies that he has a prior history of kidney stone disease.    A CT scan was obtained and is on the 9 x 4 x 5 mm calculus in the right proximal ureter associated with right hydronephrosis and hydroureter. It also found a 1.9 cm hypodense lesion of the right upper pole of the kidney.  Dr. Ronne Binning was consult it and patient will be scheduled for an emergent right ureteral stent placement.  History obtained from the ED notes:  Patient was seen at [facility] 3 weeks ago for similar complaints along with hematuria. He had a CT scan and states he was diagnosed with a "small kidney stone" but was unable to follow up with urology due to transportation and financial issues. Complains of symptoms associated with nausea. Denies fever, chills, chest pain, shortness of breath, vomiting,  diarrhea, dysuria, testicular pain or swelling.  History reviewed. No pertinent past medical history.  History reviewed. No pertinent past surgical history.  Home Medications:    Medication List    Notice    You have not been prescribed any medications.      Allergies:  Allergies  Allergen Reactions  . Other Swelling    Pt reports allergic to ALL fruits except peaches and watermelon.  . Silver Swelling    Tongue and throat    No family history on file.  Social History: Patient has a history of cocaine use.  ROS: A complete review of systems was performed.  All systems are negative except for pertinent findings as noted.  Physical Exam:  Vital signs in last 24 hours: Temp:  [98 F (36.7 C)] 98 F (36.7 C) (09/01 0614) Pulse Rate:  [56-84] 56 (09/01 0900) Resp:  [18] 18 (09/01 0614) BP: (142-170)/(89-115) 146/96 mmHg (09/01 0900) SpO2:  [97 %-99 %] 97 % (09/01 0900) Weight:  [205 lb (92.987 kg)] 205 lb (92.987 kg) (09/01 1610) Constitutional:  Lethargic, No acute distress. HEENT: [place] AT, moist mucus membranes.  Trachea midline, no masses Cardiovascular: Regular rate and rhythm, no clubbing, cyanosis, or edema. Respiratory: Normal respiratory effort, lungs clear bilaterally GI: Abdomen is soft, nontender, nondistended, no abdominal masses GU: No CVA tenderness Skin: No rashes, bruises or suspicious lesions Lymph: No cervical or inguinal adenopathy Neurologic: Grossly intact, no focal deficits,  moving all 4 extremities Psychiatric: Normal mood and affect   Laboratory Data:   Recent Labs  01/20/15 0626  WBC 12.3*  HGB 16.7  HCT 50.8    Recent Labs  01/20/15 0626  NA 140  K 3.4*  CL 105  CO2 26  GLUCOSE 112*  BUN 14  CREATININE 1.13  CALCIUM 9.6   No results for input(s): LABPT, INR in the last 72 hours. No results for input(s): LABURIN in the last 72 hours. Results for orders placed or performed in visit on 07/06/12  Beta Strep Culture [facility])      Status: None   Collection Time: 07/06/12  2:36 AM  Result Value Ref Range Status   Micro Text Report   Final       SOURCE: THROAT    COMMENT                   NO BETA STREPTOCOCCUS ISOLATED IN 48 HOURS   ANTIBIOTIC                                                         Radiologic Imaging: Dg Abd 1 View  01/20/2015   CLINICAL DATA:  Right-sided abdominal pain.  EXAM: ABDOMEN - 1 VIEW  COMPARISON:  None.  FINDINGS: The bowel gas pattern is unremarkable. There is scattered stool in the colon. No distended small bowel loops to suggest obstruction. No free air. The soft tissue shadows are maintained. Linear radiodensity overlying the right L2 transverse process could potentially be a ureteral calculus. The bony structures are unremarkable.  IMPRESSION: Unremarkable bowel gas pattern.  Possible upper right ureteral calculus. CT urogram may be helpful for further evaluation if clinically necessary   Electronically Signed   By: Rudie Meyer M.D.   On: 01/20/2015 07:24   Ct Renal Stone Study  01/20/2015   CLINICAL DATA:  Right lower abdominal pain and flank pain for 45 minutes. Prior hematuria and renal calculus.  EXAM: CT ABDOMEN AND PELVIS WITHOUT CONTRAST  TECHNIQUE: Multidetector CT imaging of the abdomen and pelvis was performed following the standard protocol without IV contrast.  COMPARISON:  01/20/2015  FINDINGS: Lower chest:  Unremarkable  Hepatobiliary: Unremarkable  Pancreas: Unremarkable  Spleen: Unremarkable  Adrenals/Urinary Tract: 9 by 5 by 4 mm calculus in the right proximal ureter associated with mild right hydronephrosis and hydroureter proximal to this stone.  1-2 mm left kidney upper pole nonobstructive calculus. Hypodense lesion in the right kidney upper pole measuring 1.9 cm in diameter, with very faint calcifications along its margin.  Stomach/Bowel: Unremarkable  Vascular/Lymphatic: Unremarkable  Reproductive: Unremarkable  Other: No supplemental non-categorized findings.   Musculoskeletal: Unremarkable  IMPRESSION: 1. 9 by 5 by 4 mm mildly obstructive right proximal ureteral calculus with associated mild right hydronephrosis. 2. There is also a 2 mm nonobstructive left kidney upper pole calculus. 3. 1.9 cm hypodense lesion of the right kidney upper pole has faint peripheral calcifications. Although statistically most likely to be a complex cyst, a solid mass is not entirely excluded and a Bosniak classification cannot be assigned due to lack of IV contrast. Definitive workup by renal protocol MRI with and without contrast should be considered; alternatives might include renal protocol CT with and without contrast or ultrasound.   Electronically Signed   By: Gaylyn Rong  M.D.   On: 01/20/2015 08:14    Impression/Assessment:  Patient with a calculus in the right proximal ureter associated with mid right hydronephrosis and hydroureter proximal to the stone. His pain is intractable.  There is also a finding of a 1.9 cm hypodense lesion of the right kidney upper pole which has faint peripheral calcifications.   Plan:  Patient will be scheduled for an emergent right ureteral stent placement. The procedures and risk involved are explained to the patient's mother. She voices her understanding.  After the ureteral stent placement, patient will be discharged to home and will follow-up with urology in 1-2 weeks for definitive treatment for his proximal stone.  The hypodense lesion of the right kidney will also be worked up as an outpatient.   01/20/2015, 10:32 AM   Thank you for involving me in this patient's care, I will continue to follow along.Please page with any further questions or concerns. Harshith Pursell, PA-C

## 2015-02-09 NOTE — Transfer of Care (Signed)
Immediate Anesthesia Transfer of Care Note  Patient: Nicolas Shaw  Procedure(s) Performed: Procedure(s): URETEROSCOPY WITH HOLMIUM LASER LITHOTRIPSY (Right) CYSTOSCOPY WITH STENT PLACEMENT (Right)  Patient Location: PACU  Anesthesia Type:General  Level of Consciousness: sedated  Airway & Oxygen Therapy: Patient Spontanous Breathing and Patient connected to face mask oxygen  Post-op Assessment: Report given to RN and Post -op Vital signs reviewed and stable  Post vital signs: Reviewed and stable  Last Vitals:  Filed Vitals:   02/09/15 1211  BP: 124/69  Pulse: 67  Temp: 36.8 C  Resp: 16    Complications: No apparent anesthesia complications

## 2015-02-09 NOTE — Interval H&P Note (Signed)
History and Physical Interval Note:  02/09/2015 12:10 PM  Nicolas Shaw  has presented today for surgery, with the diagnosis of RIGHT URETERAL STONE  The various methods of treatment have been discussed with the patient and family. After consideration of risks, benefits and other options for treatment, the patient has consented to  Procedure(s): URETEROSCOPY WITH HOLMIUM LASER LITHOTRIPSY (Right) CYSTOSCOPY WITH STENT PLACEMENT (Right) as a surgical intervention .  The patient's history has been reviewed, patient examined, no change in status, stable for surgery.  I have reviewed the patient's chart and labs.  Questions were answered to the patient's satisfaction.     Hildred Laser

## 2015-02-16 LAB — STONE ANALYSIS
CA OXALATE, DIHYDRATE: 35 %
CA OXALATE, MONOHYDR.: 50 %
Ca phos cry stone ql IR: 15 %
STONE WEIGHT KSTONE: 33 mg

## 2015-02-18 ENCOUNTER — Other Ambulatory Visit: Payer: Self-pay

## 2015-02-21 NOTE — Telephone Encounter (Signed)
Patient is schd for 02-24-15

## 2015-02-24 ENCOUNTER — Ambulatory Visit (INDEPENDENT_AMBULATORY_CARE_PROVIDER_SITE_OTHER): Payer: Self-pay | Admitting: Urology

## 2015-02-24 VITALS — BP 143/83 | HR 90 | Ht 73.0 in | Wt 197.5 lb

## 2015-02-24 DIAGNOSIS — N200 Calculus of kidney: Secondary | ICD-10-CM

## 2015-02-24 LAB — URINALYSIS, COMPLETE
Glucose, UA: NEGATIVE
Nitrite, UA: NEGATIVE
PH UA: 5.5 (ref 5.0–7.5)
SPEC GRAV UA: 1.025 (ref 1.005–1.030)
UUROB: 1 mg/dL (ref 0.2–1.0)

## 2015-02-24 LAB — MICROSCOPIC EXAMINATION
EPITHELIAL CELLS (NON RENAL): NONE SEEN /HPF (ref 0–10)
RBC, UA: >30 /hpf — AB (ref 0–?)
RENAL EPITHEL UA: NONE SEEN /HPF

## 2015-02-24 MED ORDER — LIDOCAINE HCL 2 % EX GEL
1.0000 "application " | Freq: Once | CUTANEOUS | Status: AC
  Administered 2015-02-24: 1 via URETHRAL

## 2015-02-24 MED ORDER — CIPROFLOXACIN HCL 500 MG PO TABS
500.0000 mg | ORAL_TABLET | Freq: Once | ORAL | Status: AC
  Administered 2015-02-24: 500 mg via ORAL

## 2015-02-24 NOTE — Progress Notes (Signed)
    Cystoscopy Procedure Note  Patient identification was confirmed, informed consent was obtained, and patient was prepped using Betadine solution.  Lidocaine jelly was administered per urethral meatus.    Preoperative abx where received prior to procedure.     Pre-Procedure: - Inspection reveals a normal caliber ureteral meatus.  Procedure: The flexible cystoscope was introduced without difficulty  - Upon entering the bladder, the right ureteral stent was visualized and grasped flex the graspers. It was then removed intact.   Post-Procedure: - Patient tolerated the procedure well   Plan: The patient will follow-up in 1 month's time with a renal ultrasound. We will also go over his stone analysis results of that time.

## 2015-03-14 ENCOUNTER — Other Ambulatory Visit: Payer: Self-pay | Admitting: Urology

## 2015-03-14 DIAGNOSIS — N200 Calculus of kidney: Secondary | ICD-10-CM

## 2015-03-17 ENCOUNTER — Ambulatory Visit: Admission: RE | Admit: 2015-03-17 | Payer: MEDICAID | Source: Ambulatory Visit

## 2015-03-24 ENCOUNTER — Ambulatory Visit: Payer: Self-pay | Admitting: Urology

## 2015-03-24 ENCOUNTER — Encounter: Payer: Self-pay | Admitting: Urology

## 2015-04-28 ENCOUNTER — Telehealth: Payer: Self-pay | Admitting: Urology

## 2015-04-28 NOTE — Telephone Encounter (Signed)
Patient never showed up for his follow up appointment and he cancelled his RUS that  You ordered. I have tried calling him to find out why and see if wanted to reschd these appointments, but his phone is not taking any calls and there was no voicemail. Just wanted you to be aware and his chart to be documented.  Nicolas DusterMichelle

## 2015-06-28 ENCOUNTER — Telehealth: Payer: Self-pay | Admitting: Urology

## 2015-06-28 NOTE — Telephone Encounter (Signed)
Pt has been having pain about 5 inches below his belly button area.  Pt states this has been going on for the last 2 months and his pain is a 6 out of 10.  Please call patient to discuss if he needs to schedule an appt in our office.  Pt is at work and cannot take calls while he is on the floor working.  Please leave a message and he will return call.

## 2015-06-29 NOTE — Telephone Encounter (Signed)
Spoke with pt in reference to pain. Pt stated that he has been having bilateral lower abd pain. Pt described the pain to be in the same place as to when he had the kidney stones. Made pt aware he no showed for his renal u/s back in October. Pt stated that he was not going to have the u/s. At which point made pt aware he needs to be seen again for an office visit. Pt was transferred to the front to make f/u appt.

## 2015-07-01 ENCOUNTER — Ambulatory Visit
Admission: RE | Admit: 2015-07-01 | Discharge: 2015-07-01 | Disposition: A | Discharge status: Home / Self Care | Payer: Self-pay | Source: Ambulatory Visit | Attending: Urology | Admitting: Urology

## 2015-07-01 DIAGNOSIS — R109 Unspecified abdominal pain: Secondary | ICD-10-CM | POA: Insufficient documentation

## 2015-07-01 DIAGNOSIS — N200 Calculus of kidney: Secondary | ICD-10-CM | POA: Insufficient documentation

## 2015-07-01 DIAGNOSIS — N281 Cyst of kidney, acquired: Secondary | ICD-10-CM | POA: Insufficient documentation

## 2015-07-01 DIAGNOSIS — N329 Bladder disorder, unspecified: Secondary | ICD-10-CM | POA: Insufficient documentation

## 2015-07-06 ENCOUNTER — Telehealth: Payer: Self-pay

## 2015-07-06 NOTE — Telephone Encounter (Signed)
-----   Message from Hildred Laser, MD sent at 07/06/2015  9:38 AM EST ----- Please let patient know that his renal ultrasound is normal with no signs of renal blockage. There is no urological source of his pain. He is welcome to keep his appointment tomorrow, but he will not be given pain medications.

## 2015-07-06 NOTE — Telephone Encounter (Signed)
Spoke with pt in reference to RUS results. Pt voiced understanding and stated he would like to keep appt tomorrow. Reinforced with pt no pain meds would be given. Pt voiced understanding.

## 2015-07-07 ENCOUNTER — Encounter: Payer: Self-pay | Admitting: Urology

## 2015-07-07 ENCOUNTER — Ambulatory Visit (INDEPENDENT_AMBULATORY_CARE_PROVIDER_SITE_OTHER): Payer: Self-pay | Admitting: Urology

## 2015-07-07 VITALS — BP 137/83 | HR 91 | Ht 72.0 in | Wt 202.3 lb

## 2015-07-07 DIAGNOSIS — N200 Calculus of kidney: Secondary | ICD-10-CM

## 2015-07-07 DIAGNOSIS — N281 Cyst of kidney, acquired: Secondary | ICD-10-CM

## 2015-07-07 DIAGNOSIS — Q6100 Congenital renal cyst, unspecified: Secondary | ICD-10-CM

## 2015-07-07 LAB — URINALYSIS, COMPLETE
Bilirubin, UA: NEGATIVE
GLUCOSE, UA: NEGATIVE
KETONES UA: NEGATIVE
Leukocytes, UA: NEGATIVE
NITRITE UA: NEGATIVE
Protein, UA: NEGATIVE
RBC, UA: NEGATIVE
UUROB: 0.2 mg/dL (ref 0.2–1.0)
pH, UA: 5.5 (ref 5.0–7.5)

## 2015-07-07 LAB — MICROSCOPIC EXAMINATION
Bacteria, UA: NONE SEEN
Epithelial Cells (non renal): NONE SEEN /hpf (ref 0–10)

## 2015-07-07 NOTE — Progress Notes (Signed)
07/07/2015 3:44 PM   Nicolas Shaw [birth year] [id number]  Referring provider: No referring provider defined for this encounter.  Chief Complaint  Patient presents with  . Nephrolithiasis    f/u    HPI: The patient is a 30 year old male presents for follow-up after having a right ureteroscopy with stone extraction. This process rotation renal ultrasound shows no hydronephrosis. It does however show a cyst in his right kidney that was seen on his previous CT scan that warrants follow-up in one year. He does note bilateral flank pain for the last 2 months. He says he gets worse when he is working. Sitting down makes it better. Again his renal ultrasound showed no hydronephrosis.   PMH: Past Medical History  Diagnosis Date  . Asthma   . Depression   . History of stomach ulcers   . History of kidney stones   . Drug abuse, cocaine type     + UDS ON 01-20-15  . Complication of anesthesia     HARD TO WAKE UP AFTER CYSTO 01-20-15    Surgical History: Past Surgical History  Procedure Laterality Date  . Cystoscopy with stent placement Right 01/20/2015    Procedure: CYSTOSCOPY WITH STENT PLACEMENT;  Surgeon: Malen Gauze, MD;  Location: [facility];  Service: Urology;  Laterality: Right;  . Ureteroscopy with holmium laser lithotripsy Right 02/09/2015    Procedure: URETEROSCOPY WITH HOLMIUM LASER LITHOTRIPSY;  Surgeon: Hildred Laser, MD;  Location: [facility];  Service: Urology;  Laterality: Right;  . Cystoscopy with stent placement Right 02/09/2015    Procedure: CYSTOSCOPY WITH STENT PLACEMENT;  Surgeon: Hildred Laser, MD;  Location: [facility];  Service: Urology;  Laterality: Right;    Home Medications:    Medication List    Notice  As of 07/07/2015  3:44 PM   You have not been prescribed any medications.      Allergies:  Allergies  Allergen Reactions  . Other Swelling    Pt reports allergic to ALL fruits except peaches and watermelon.  . Silver Swelling    Tongue and  throat    Family History: Family History  Problem Relation Age of Onset  . Prostate cancer Father   . Kidney disease Maternal Grandmother     kidney cancer  . Breast cancer Maternal Grandmother   . Breast cancer      aunt  . Diabetes Maternal Grandmother   . Gout Maternal Grandmother   . Gout      aunt    Social History:  reports that he has been smoking Cigarettes.  He has been smoking about 0.30 packs per day. He does not have any smokeless tobacco history on file. He reports that he drinks alcohol. He reports that he uses illicit drugs ("Crack" cocaine).  ROS: UROLOGY Frequent Urination?: No Hard to postpone urination?: No Burning/pain with urination?: No Get up at night to urinate?: No Leakage of urine?: No Urine stream starts and stops?: No Trouble starting stream?: No Do you have to strain to urinate?: No Blood in urine?: No Urinary tract infection?: No Sexually transmitted disease?: No Injury to kidneys or bladder?: No Painful intercourse?: No Weak stream?: No Erection problems?: No Penile pain?: No  Gastrointestinal Nausea?: Yes Vomiting?: No Indigestion/heartburn?: No Diarrhea?: No Constipation?: No  Constitutional Fever: No Night sweats?: No Weight loss?: Yes Fatigue?: No  Skin Skin rash/lesions?: No Itching?: No  Eyes Blurred vision?: No Double vision?: No  Ears/Nose/Throat Sore throat?: No Sinus problems?: No  Hematologic/Lymphatic Swollen  glands?: No Easy bruising?: No  Cardiovascular Leg swelling?: No Chest pain?: No  Respiratory Cough?: No Shortness of breath?: No  Endocrine Excessive thirst?: No  Musculoskeletal Back pain?: No Joint pain?: No  Neurological Headaches?: Yes Dizziness?: No  Psychologic Depression?: No Anxiety?: No  Physical Exam: BP 137/83 mmHg  Pulse 91  Ht 6' (1.829 m)  Wt 202 lb 4.8 oz (91.763 kg)  BMI 27.43 kg/m2  Constitutional:  Alert and oriented, No acute distress. HEENT: [place] AT,  moist mucus membranes.  Trachea midline, no masses. Cardiovascular: No clubbing, cyanosis, or edema. Respiratory: Normal respiratory effort, no increased work of breathing. GI: Abdomen is soft, nontender, nondistended, no abdominal masses GU: No CVA tenderness.  Skin: No rashes, bruises or suspicious lesions. Lymph: No cervical or inguinal adenopathy. Neurologic: Grossly intact, no focal deficits, moving all 4 extremities. Psychiatric: Normal mood and affect.  Laboratory Data: Lab Results  Component Value Date   WBC 8.4 02/09/2015   HGB 16.2 02/09/2015   HCT 49.1 02/09/2015   MCV 86.9 02/09/2015   PLT 235 02/09/2015    Lab Results  Component Value Date   CREATININE 0.94 02/09/2015    No results found for: PSA  No results found for: TESTOSTERONE  No results found for: HGBA1C  Urinalysis    Component Value Date/Time   COLORURINE AMBER* 01/20/2015 1110   COLORURINE Yellow 08/02/2012 1709   APPEARANCEUR HAZY* 01/20/2015 1110   APPEARANCEUR Cloudy 08/02/2012 1709   LABSPEC 1.027 01/20/2015 1110   LABSPEC 1.021 08/02/2012 1709   PHURINE 6.0 01/20/2015 1110   PHURINE 7.0 08/02/2012 1709   GLUCOSEU Negative 02/24/2015 1002   GLUCOSEU Negative 08/02/2012 1709   HGBUR 3+* 01/20/2015 1110   HGBUR Negative 08/02/2012 1709   BILIRUBINUR 2+* 02/24/2015 1002   BILIRUBINUR NEGATIVE 01/20/2015 1110   BILIRUBINUR Negative 08/02/2012 1709   KETONESUR 1+* 01/20/2015 1110   KETONESUR Negative 08/02/2012 1709   PROTEINUR 100* 01/20/2015 1110   PROTEINUR Negative 08/02/2012 1709   NITRITE Negative 02/24/2015 1002   NITRITE NEGATIVE 01/20/2015 1110   NITRITE Negative 08/02/2012 1709   LEUKOCYTESUR 1+* 02/24/2015 1002   LEUKOCYTESUR NEGATIVE 01/20/2015 1110   LEUKOCYTESUR Trace 08/02/2012 1709    Pertinent Imaging: Study Result     CLINICAL DATA: Nephrolithiasis with flank pain.  EXAM: RENAL / URINARY TRACT ULTRASOUND COMPLETE  COMPARISON: CT abdomen and pelvis  September 1 26  FINDINGS: Right Kidney:  Length: 11.4 cm. Echogenicity and renal cortical thickness are within normal limits. No perinephric fluid or hydronephrosis visualized. The previously noted partially cystic but somewhat complex mass lesion arising from the upper pole the right kidney and containing peripheral calcification appears stable compared to the prior study, measuring 1.8 x 1.7 cm currently. No other renal mass is seen on the right. There is no sonographically demonstrable calculus or ureterectasis on the right.  Left Kidney:  Length: 12.0 cm. Echogenicity and renal cortical thickness are within normal limits. No mass, perinephric fluid, or hydronephrosis visualized. No sonographically demonstrable calculus or ureterectasis identified.  Bladder:  There is debris in the urinary bladder. No urinary bladder wall thickening mass is apparent.  IMPRESSION: The somewhat complex but predominantly cystic mass with peripheral calcification noted on prior CT appears stable in size and contour. No new renal lesion is identified on the right. Given the complex nature of this right renal lesion, a followup ultrasound of the right kidney in approximately 1 year would be advisable.  Left kidney appears normal.  There is debris  in the urinary bladder, likely due to cystitis. No urinary bladder wall thickening or mass seen.     Assessment & Plan:    I feel the patient's pain is musculoskeletal in origin. He has no sign of ureteral obstruction at this time. He has a renal cyst that warrants further follow-up with a renal ultrasound in 1 year's time. The patient is resting for referral to a primary care provider which were providing at this time.  1. Right ureteral stone Post instrumentation renal ultrasound unremarkable for hydronephrosis. No further workup needed  2. Right renal cyst Follow-up in one year with repeat renal ultrasound.  3. Musculoskeletal  pain Recommended Advil 600 mg 3 times a day when necessary  Return in about 3 months (around 10/04/2015) for renal ultrasound prior.  Hildred Laser, MD  [facility] [address], [address] [place], [place] [address] [phone]

## 2015-10-07 ENCOUNTER — Ambulatory Visit: Payer: Self-pay | Admitting: Urology

## 2016-02-12 IMAGING — US US RENAL
1 series · 13 of 25 positions shown · non-contrast
Comparison: CT abdomen and pelvis January 19, 25

CLINICAL DATA: Nephrolithiasis with flank pain.

EXAM:
RENAL / URINARY TRACT ULTRASOUND COMPLETE

[Series 1: us renal · 0.22mm/px · 13 of 34 slices shown]
[im 1/34]
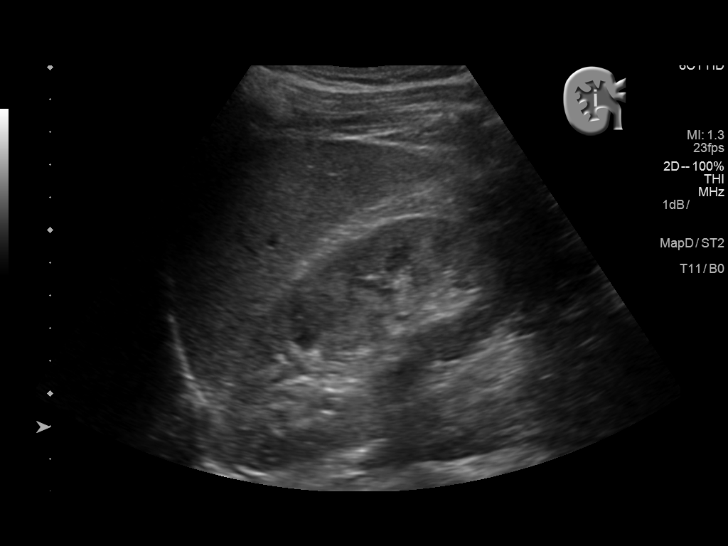
[im 3/34]
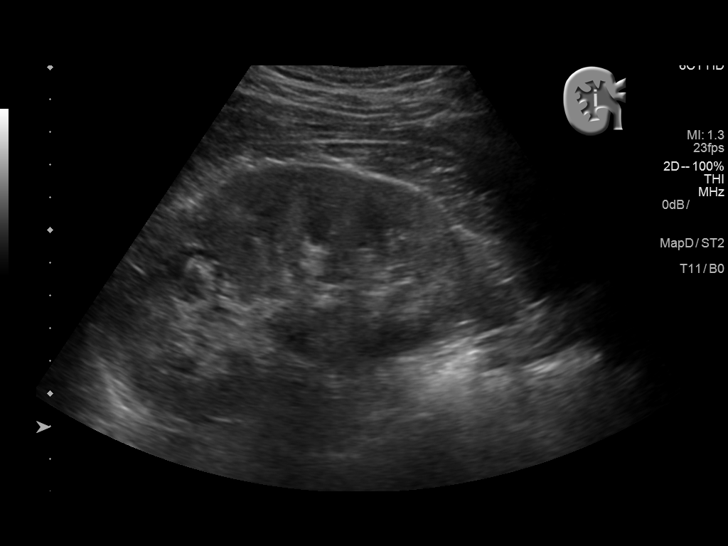
[im 6/34]
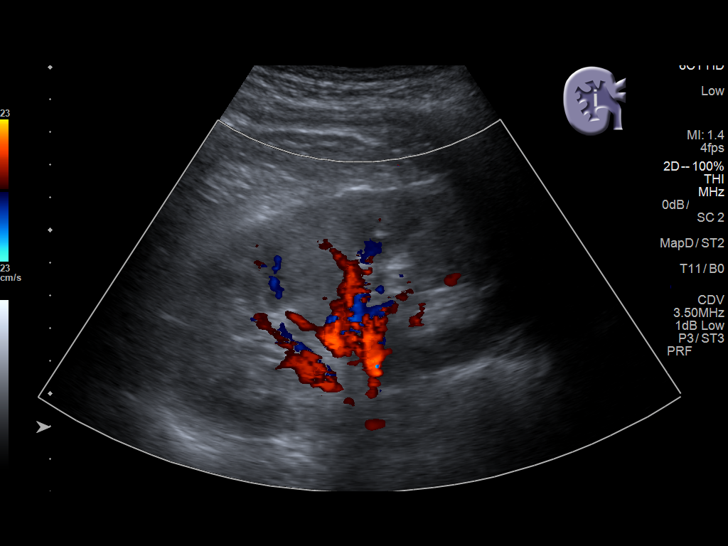
[im 9/34]
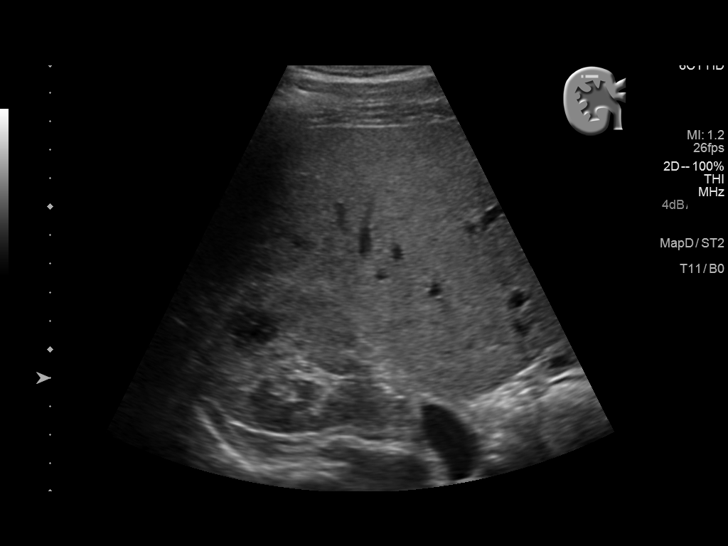
[im 12/34]
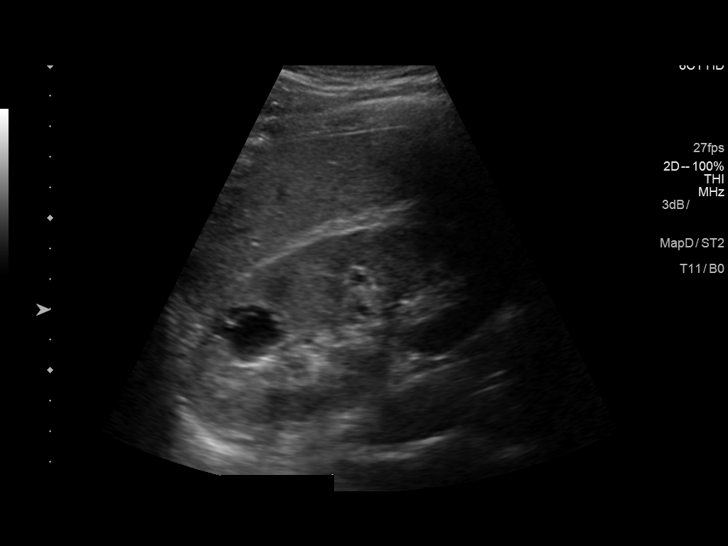
[im 14/34]
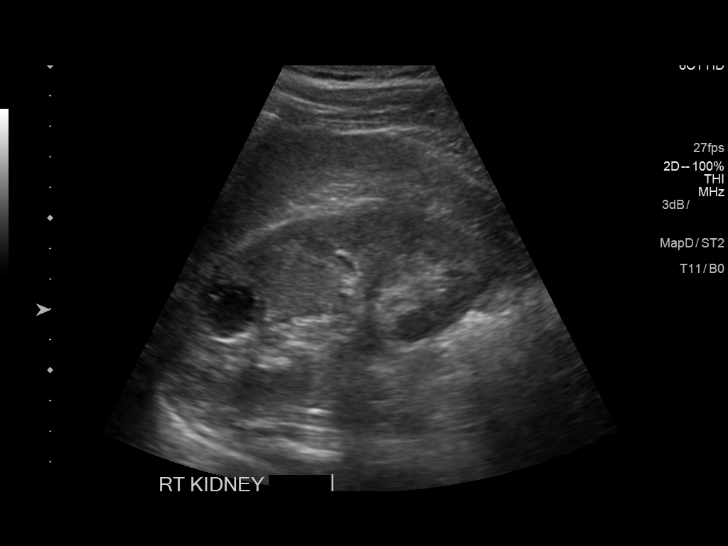
[im 17/34]
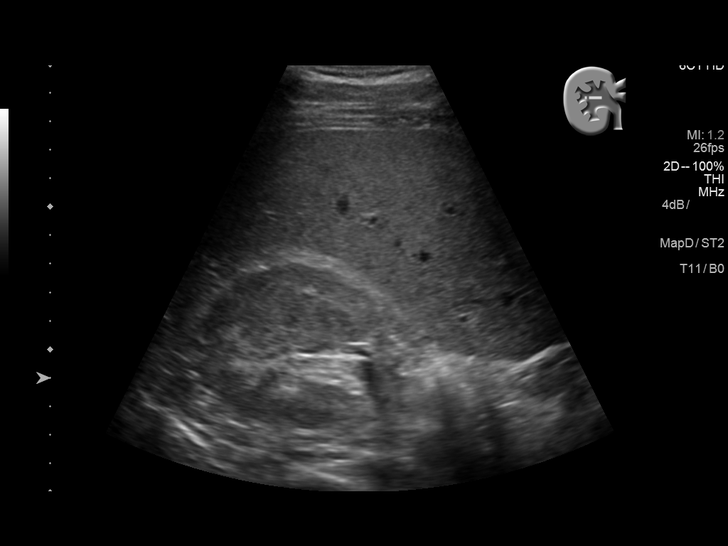
[im 20/34]
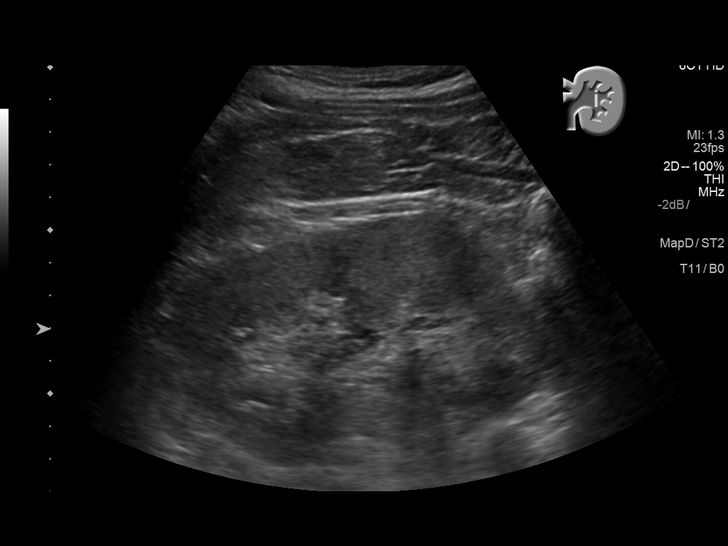
[im 23/34]
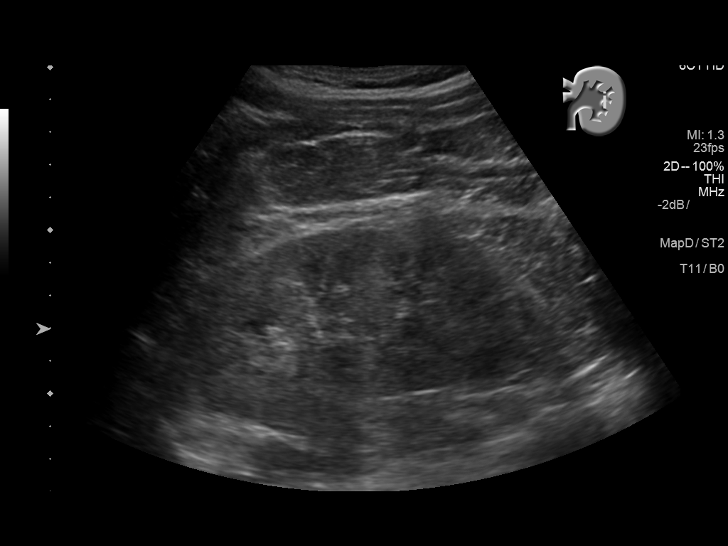
[im 25/34]
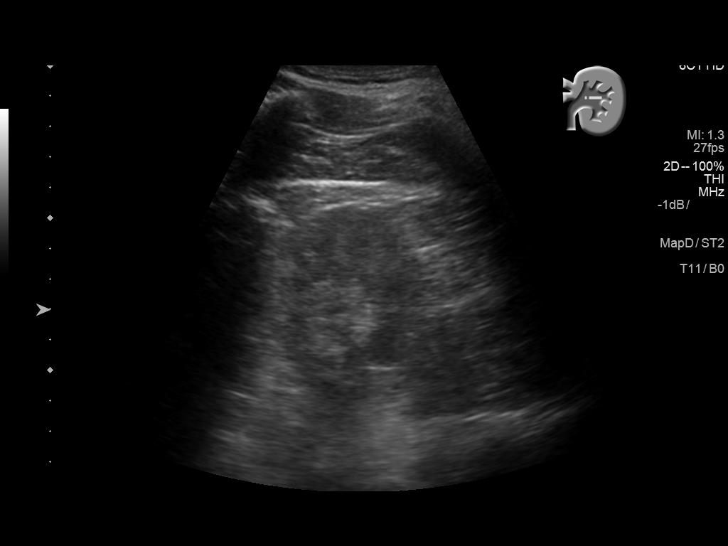
[im 28/34]
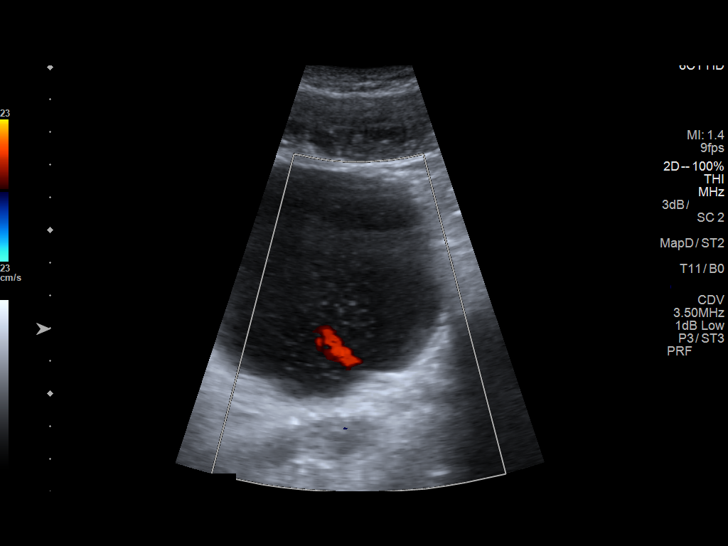
[im 31/34]
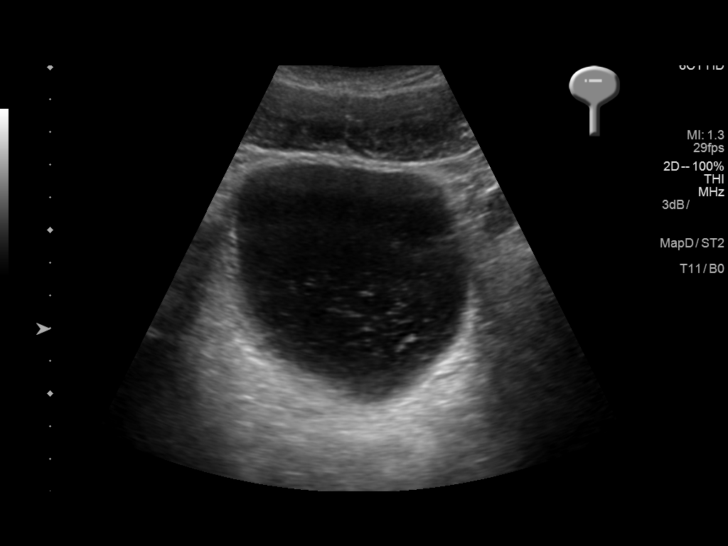
[im 34/34]
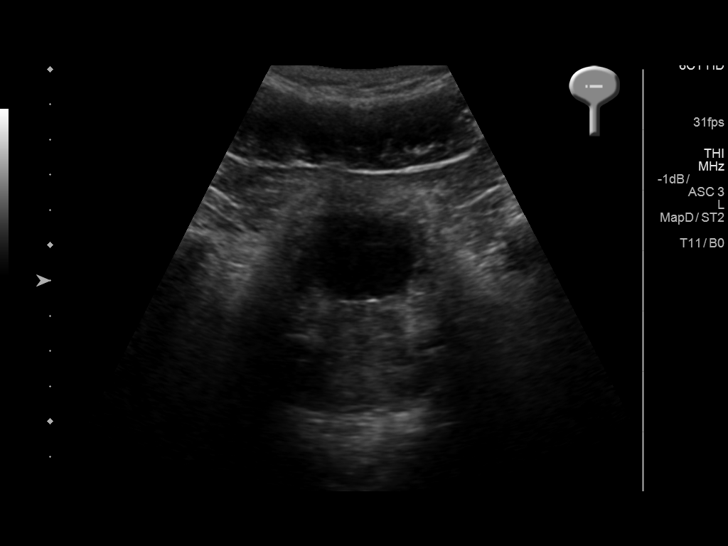

[13 of 25 positions shown; findings below may reference images not displayed]

FINDINGS: Right Kidney:

Length: 11.4 cm. Echogenicity and renal cortical thickness are
within normal limits. No perinephric fluid or hydronephrosis
visualized. The previously noted partially cystic but somewhat
complex mass lesion arising from the upper pole the right kidney and
containing peripheral calcification appears stable compared to the
prior study, measuring 1.8 x 1.7 cm currently. No other renal mass
is seen on the right. There is no sonographically demonstrable
calculus or ureterectasis on the right.

Left Kidney:

Length: 12.0 cm. Echogenicity and renal cortical thickness are
within normal limits. No mass, perinephric fluid, or hydronephrosis
visualized. No sonographically demonstrable calculus or
ureterectasis identified.

Bladder:

There is debris in the urinary bladder. No urinary bladder wall
thickening mass is apparent.
IMPRESSION: The somewhat complex but predominantly cystic mass with peripheral
calcification noted on prior CT appears stable in size and contour.
No new renal lesion is identified on the right. Given the complex
nature of this right renal lesion, a followup ultrasound of the
right kidney in approximately 1 year would be advisable.

Left kidney appears normal.

There is debris in the urinary bladder, likely due to cystitis. No
urinary bladder wall thickening or mass seen.

## 2016-02-15 IMAGING — RF DG ABDOMEN 2V
2 series · 2 of 2 positions shown · non-contrast
Comparison: none

[Series 1: pädiatrie · 1 of 1 slices shown (1 of 2)]
[im 1/1]
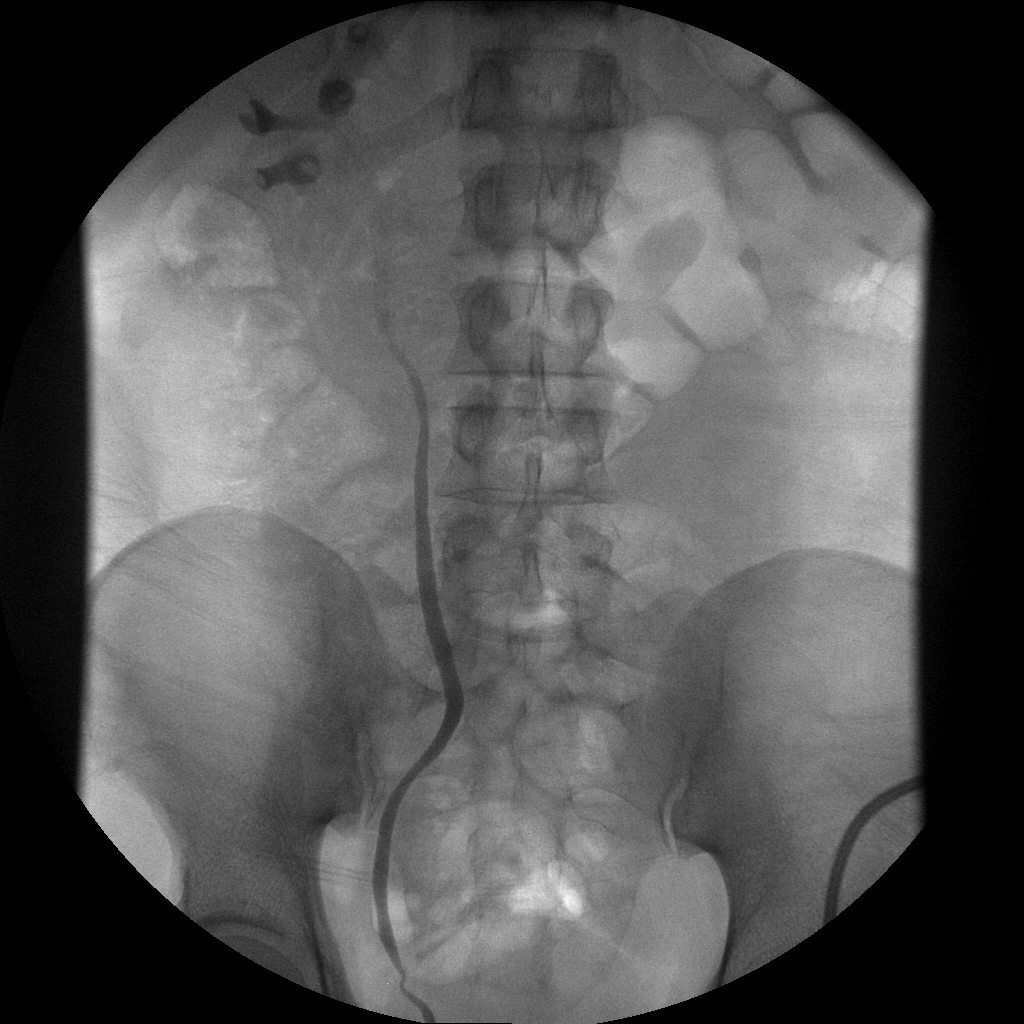

[Series 2: pädiatrie · 1 of 1 slices shown (2 of 2)]
[im 1/1]
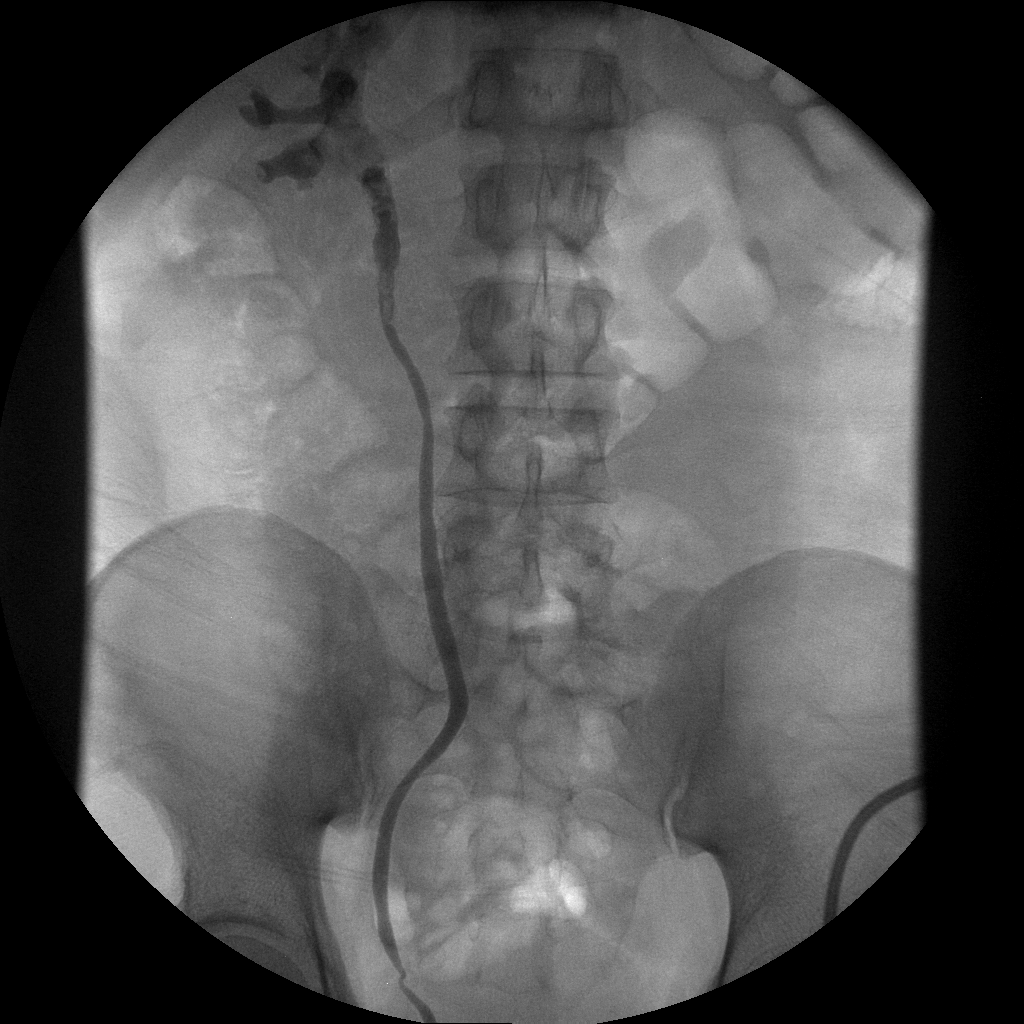

[2 of 2 positions shown; findings below may reference images not displayed]

Canned report from images found in remote index.

Refer to host system for actual result text.

## 2016-07-26 ENCOUNTER — Encounter: Payer: Self-pay | Admitting: Emergency Medicine

## 2016-07-26 DIAGNOSIS — F17210 Nicotine dependence, cigarettes, uncomplicated: Secondary | ICD-10-CM | POA: Insufficient documentation

## 2016-07-26 DIAGNOSIS — H00012 Hordeolum externum right lower eyelid: Secondary | ICD-10-CM | POA: Insufficient documentation

## 2016-07-26 DIAGNOSIS — J45909 Unspecified asthma, uncomplicated: Secondary | ICD-10-CM | POA: Insufficient documentation

## 2016-07-26 DIAGNOSIS — L0501 Pilonidal cyst with abscess: Secondary | ICD-10-CM | POA: Insufficient documentation

## 2016-07-26 NOTE — ED Triage Notes (Addendum)
Pt has abscess to sacral area for few years and states now its worse, has drained. Has never seen a Care[facility] advisersurgeon or a doctor for the same. Pt also has sty to right eye.

## 2016-07-27 ENCOUNTER — Emergency Department
Admission: EM | Admit: 2016-07-27 | Discharge: 2016-07-27 | Disposition: A | Discharge status: Home / Self Care | Payer: Self-pay | Attending: Emergency Medicine | Admitting: Emergency Medicine

## 2016-07-27 DIAGNOSIS — H00012 Hordeolum externum right lower eyelid: Secondary | ICD-10-CM

## 2016-07-27 DIAGNOSIS — L0501 Pilonidal cyst with abscess: Secondary | ICD-10-CM

## 2016-07-27 MED ORDER — CEPHALEXIN 500 MG PO CAPS: 500.0000 mg | ORAL_CAPSULE | Freq: Two times a day (BID) | ORAL | 0 refills | Status: AC

## 2016-07-27 MED ORDER — ERYTHROMYCIN 5 MG/GM OP OINT: TOPICAL_OINTMENT | Freq: Three times a day (TID) | OPHTHALMIC | 0 refills | Status: AC

## 2016-07-27 MED ORDER — ERYTHROMYCIN 5 MG/GM OP OINT
TOPICAL_OINTMENT | Freq: Once | OPHTHALMIC | Status: AC
  Administered 2016-07-27: 1 via OPHTHALMIC
  Filled 2016-07-27: qty 1

## 2016-07-27 MED ORDER — CEPHALEXIN 500 MG PO CAPS
500.0000 mg | ORAL_CAPSULE | Freq: Once | ORAL | Status: AC
  Administered 2016-07-27: 500 mg via ORAL
  Filled 2016-07-27: qty 1

## 2016-07-27 MED ORDER — LIDOCAINE HCL (PF) 1 % IJ SOLN
5.0000 mL | Freq: Once | INTRAMUSCULAR | Status: AC
  Administered 2016-07-27: 5 mL via INTRADERMAL
  Filled 2016-07-27: qty 5

## 2016-07-27 NOTE — ED Notes (Signed)

## 2016-07-27 NOTE — ED Provider Notes (Signed)
[facility] Emergency Department Provider Note   First MD Initiated Contact with Patient 07/27/16 0246     (approximate)  I have reviewed the triage vital signs and the nursing notes.   HISTORY  Chief Complaint Abscess   HPI Nicolas Shaw is a 31 y.o. male with below list of chronic medical conditions presents to the emergency department with "possible abscess in his sacral area". Patient states she's had an area of swelling there for "a long time years however status post striking his sacral area accidentally 2 weeks ago areas become progressively worsened over the past few days. Patient also admits to a stye on his right eye that has been there for approximately 3 weeks. Patient states that he is use warm compress on the stye without any improvement   Past Medical History:  Diagnosis Date  . Asthma   . Complication of anesthesia    HARD TO WAKE UP AFTER CYSTO 01-20-15  . Depression   . Drug abuse, cocaine type    + UDS ON 01-20-15  . History of kidney stones   . History of stomach ulcers     Patient Active Problem List   Diagnosis Date Noted  . Flank pain 02/05/2015  . Hydronephrosis, right 02/05/2015  . Renal mass, right 02/05/2015  . Nephrolithiasis 01/20/2015  . Compulsive tobacco user syndrome 06/17/2013    Past Surgical History:  Procedure Laterality Date  . CYSTOSCOPY WITH STENT PLACEMENT Right 01/20/2015   Procedure: CYSTOSCOPY WITH STENT PLACEMENT;  Surgeon: Malen Gauze, MD;  Location: [facility];  Service: Urology;  Laterality: Right;  . CYSTOSCOPY WITH STENT PLACEMENT Right 02/09/2015   Procedure: CYSTOSCOPY WITH STENT PLACEMENT;  Surgeon: Hildred Laser, MD;  Location: [facility];  Service: Urology;  Laterality: Right;  . URETEROSCOPY WITH HOLMIUM LASER LITHOTRIPSY Right 02/09/2015   Procedure: URETEROSCOPY WITH HOLMIUM LASER LITHOTRIPSY;  Surgeon: Hildred Laser, MD;  Location: [facility];  Service: Urology;  Laterality: Right;     Prior to Admission medications   Not on File    Allergies Other and Silver  Family History  Problem Relation Age of Onset  . Prostate cancer Father   . Kidney disease Maternal Grandmother     kidney cancer  . Breast cancer Maternal Grandmother   . Diabetes Maternal Grandmother   . Gout Maternal Grandmother   . Breast cancer      aunt  . Gout      aunt    Social History Social History  Substance Use Topics  . Smoking status: Current Every Day Smoker    Packs/day: 0.30    Types: Cigarettes  . Smokeless tobacco: Not on file  . Alcohol use 0.0 oz/week     Comment: previous    Review of Systems Constitutional: No fever/chills Eyes: No visual changes. ENT: No sore throat. Cardiovascular: Denies chest pain. Respiratory: Denies shortness of breath. Gastrointestinal: No abdominal pain.  No nausea, no vomiting.  No diarrhea.  No constipation. Genitourinary: Negative for dysuria. Musculoskeletal: Negative for back pain. Skin: Negative for rash. Neurological: Negative for headaches, focal weakness or numbness.  10-point ROS otherwise negative.  ____________________________________________   PHYSICAL EXAM:  VITAL SIGNS: ED Triage Vitals  Enc Vitals Group     BP 07/26/16 2353 (!) 164/97     Pulse Rate 07/26/16 2353 (!) 102     Resp 07/26/16 2353 18     Temp 07/26/16 2353 99.2 F (37.3 C)     Temp Source  07/26/16 2353 Oral     SpO2 07/26/16 2353 100 %     Weight 07/26/16 2354 215 lb (97.5 kg)     Height 07/26/16 2354 6' (1.829 m)     Head Circumference --      Peak Flow --      Pain Score 07/26/16 2354 4     Pain Loc --      Pain Edu? --      Excl. in GC? --     Constitutional: Alert and oriented. Well appearing and in no acute distress. Eyes: Conjunctivae are normal. PERRL. EOMI.Right inferior eyelid stye Head: Atraumatic. Ears:  Healthy appearing ear canals and TMs bilaterally Nose: No congestion/rhinnorhea. Mouth/Throat: Mucous membranes are  moist.  Oropharynx non-erythematous. Neck: No stridor.  Cardiovascular: Normal rate, regular rhythm. Good peripheral circulation. Grossly normal heart sounds. Respiratory: Normal respiratory effort.  No retractions. Lungs CTAB. Gastrointestinal: Soft and nontender. No distention.  Musculoskeletal: No lower extremity tenderness nor edema. No gross deformities of extremities. Neurologic:  Normal speech and language. No gross focal neurologic deficits are appreciated.  Skin:  Skin is warm, dry and intact. No rash noted. Swelling noted superior to the patient's sacrum mild flocculence with palpation no overlying erythema Psychiatric: Mood and affect are normal. Speech and behavior are normal.  ____________________________________________      .Marland KitchenIncision and Drainage Date/Time: 07/27/2016 4:41 AM Performed by: Darci Current Authorized by: Darci Current   Consent:    Consent obtained:  Verbal   Consent given by:  Patient   Risks discussed:  Bleeding, incomplete drainage and pain   Alternatives discussed:  No treatment Location:    Type:  Pilonidal cyst   Size:  3x3cm   Location:  Trunk   Trunk location:  Back Pre-procedure details:    Skin preparation:  Chloraprep Anesthesia (see MAR for exact dosages):    Anesthesia method:  Local infiltration   Local anesthetic:  Lidocaine 1% w/o epi Procedure type:    Complexity:  Simple Procedure details:    Needle aspiration: no     Incision types:  Cruciate   Incision depth:  Subcutaneous   Scalpel blade:  11   Drainage:  Bloody and purulent   Drainage amount:  Scant   Wound treatment:  Wound left open   Packing materials:  None Post-procedure details:    Patient tolerance of procedure:  Tolerated well, no immediate complications       INITIAL IMPRESSION / ASSESSMENT AND PLAN / ED COURSE  Pertinent labs & imaging results that were available during my care of the patient were reviewed by me and considered in my medical  decision making (see chart for details).  Patient given Keflex and will be prescribed same for home. In addition patient given erythromycin ointment for right eyelid stye.      ____________________________________________  FINAL CLINICAL IMPRESSION(S) / ED DIAGNOSES  Final diagnoses:  Pilonidal cyst with abscess  Hordeolum externum of right lower eyelid     MEDICATIONS GIVEN DURING THIS VISIT:  Medications  erythromycin ophthalmic ointment (not administered)  lidocaine (PF) (XYLOCAINE) 1 % injection 5 mL (5 mLs Intradermal Given 07/27/16 0348)  cephALEXin (KEFLEX) capsule 500 mg (500 mg Oral Given 07/27/16 0348)     NEW OUTPATIENT MEDICATIONS STARTED DURING THIS VISIT:  New Prescriptions   No medications on file    Modified Medications   No medications on file    Discontinued Medications   No medications on file  Note:  This document was prepared using Dragon voice recognition software and may include unintentional dictation errors.    Darci Currentandolph N Mariann Palo, MD 07/27/16 [phone]

## 2016-07-27 NOTE — ED Notes (Signed)
Pt c/o abscess that is 2.5 inches long and 0.75 inches wide on the sacrum; the pt states having the abscess for about 2 years; pt states he "drains" the abscess periodically by pushing on the bottom of it; pt states pain of 2/10; pt also c/o 2 sty on the bottom lid of the right eye

## 2017-10-06 ENCOUNTER — Emergency Department: Payer: Self-pay

## 2017-10-06 ENCOUNTER — Encounter: Payer: Self-pay | Admitting: Emergency Medicine

## 2017-10-06 ENCOUNTER — Emergency Department
Admission: EM | Admit: 2017-10-06 | Discharge: 2017-10-06 | Disposition: A | Discharge status: Home / Self Care | Payer: Self-pay | Attending: Emergency Medicine | Admitting: Emergency Medicine

## 2017-10-06 ENCOUNTER — Other Ambulatory Visit: Payer: Self-pay

## 2017-10-06 DIAGNOSIS — R5381 Other malaise: Secondary | ICD-10-CM | POA: Insufficient documentation

## 2017-10-06 DIAGNOSIS — F17210 Nicotine dependence, cigarettes, uncomplicated: Secondary | ICD-10-CM | POA: Insufficient documentation

## 2017-10-06 DIAGNOSIS — E860 Dehydration: Secondary | ICD-10-CM | POA: Insufficient documentation

## 2017-10-06 DIAGNOSIS — J45909 Unspecified asthma, uncomplicated: Secondary | ICD-10-CM | POA: Insufficient documentation

## 2017-10-06 LAB — URINALYSIS, COMPLETE (UACMP) WITH MICROSCOPIC
BACTERIA UA: NONE SEEN
BILIRUBIN URINE: NEGATIVE
Glucose, UA: NEGATIVE mg/dL
Ketones, ur: 5 mg/dL — AB
Leukocytes, UA: NEGATIVE
Nitrite: NEGATIVE
Protein, ur: NEGATIVE mg/dL
SPECIFIC GRAVITY, URINE: 1.021 (ref 1.005–1.030)
SQUAMOUS EPITHELIAL / LPF: NONE SEEN (ref 0–5)
pH: 5 (ref 5.0–8.0)

## 2017-10-06 LAB — CBC WITH DIFFERENTIAL/PLATELET
Basophils Absolute: 0 10*3/uL (ref 0–0.1)
Basophils Relative: 0 %
EOS ABS: 0 10*3/uL (ref 0–0.7)
Eosinophils Relative: 0 %
HEMATOCRIT: 56.1 % — AB (ref 40.0–52.0)
HEMOGLOBIN: 19.2 g/dL — AB (ref 13.0–18.0)
LYMPHS PCT: 19 %
Lymphs Abs: 1.2 10*3/uL (ref 1.0–3.6)
MCH: 29.2 pg (ref 26.0–34.0)
MCHC: 34.3 g/dL (ref 32.0–36.0)
MCV: 85.2 fL (ref 80.0–100.0)
MONOS PCT: 17 %
Monocytes Absolute: 1 10*3/uL (ref 0.2–1.0)
NEUTROS PCT: 64 %
Neutro Abs: 3.9 10*3/uL (ref 1.4–6.5)
Platelets: 185 10*3/uL (ref 150–440)
RBC: 6.58 MIL/uL — AB (ref 4.40–5.90)
RDW: 13.6 % (ref 11.5–14.5)
WBC: 6.2 10*3/uL (ref 3.8–10.6)

## 2017-10-06 LAB — COMPREHENSIVE METABOLIC PANEL
ALBUMIN: 4.9 g/dL (ref 3.5–5.0)
ALK PHOS: 97 U/L (ref 38–126)
ALT: 36 U/L (ref 17–63)
AST: 33 U/L (ref 15–41)
Anion gap: 12 (ref 5–15)
BILIRUBIN TOTAL: 0.7 mg/dL (ref 0.3–1.2)
BUN: 11 mg/dL (ref 6–20)
CALCIUM: 9.5 mg/dL (ref 8.9–10.3)
CO2: 23 mmol/L (ref 22–32)
Chloride: 98 mmol/L — ABNORMAL LOW (ref 101–111)
Creatinine, Ser: 1.03 mg/dL (ref 0.61–1.24)
GFR calc Af Amer: >60 mL/min (ref >60)
GFR calc non Af Amer: >60 mL/min (ref >60)
GLUCOSE: 149 mg/dL — AB (ref 65–99)
Potassium: 3.2 mmol/L — ABNORMAL LOW (ref 3.5–5.1)
Sodium: 133 mmol/L — ABNORMAL LOW (ref 135–145)
TOTAL PROTEIN: 7.9 g/dL (ref 6.5–8.1)

## 2017-10-06 LAB — LIPASE, BLOOD: Lipase: 31 U/L (ref 11–51)

## 2017-10-06 MED ORDER — KETOROLAC TROMETHAMINE 30 MG/ML IJ SOLN
15.0000 mg | INTRAMUSCULAR | Status: AC
  Administered 2017-10-06: 15 mg via INTRAVENOUS
  Filled 2017-10-06: qty 1

## 2017-10-06 MED ORDER — KETOROLAC TROMETHAMINE 60 MG/2ML IM SOLN
15.0000 mg | Freq: Once | INTRAMUSCULAR | Status: DC
  Filled 2017-10-06: qty 2

## 2017-10-06 MED ORDER — SODIUM CHLORIDE 0.9 % IV BOLUS
1000.0000 mL | Freq: Once | INTRAVENOUS | Status: AC
  Administered 2017-10-06: 1000 mL via INTRAVENOUS

## 2017-10-06 MED ORDER — MORPHINE SULFATE (PF) 10 MG/ML IJ SOLN
8.00 | INTRAMUSCULAR | Status: DC
Start: ? — End: 2017-10-06

## 2017-10-06 MED ORDER — NAPROXEN 500 MG PO TABS: 500.0000 mg | ORAL_TABLET | Freq: Two times a day (BID) | ORAL | 0 refills | Status: AC

## 2017-10-06 MED ORDER — ONDANSETRON 4 MG PO TBDP: 4.0000 mg | ORAL_TABLET | Freq: Three times a day (TID) | ORAL | 0 refills | Status: AC | PRN

## 2017-10-06 MED ORDER — IOPAMIDOL (ISOVUE-300) INJECTION 61%
100.0000 mL | Freq: Once | INTRAVENOUS | Status: AC | PRN
  Administered 2017-10-06: 100 mL via INTRAVENOUS

## 2017-10-06 MED ORDER — ONDANSETRON HCL 4 MG/2ML IJ SOLN
4.0000 mg | Freq: Once | INTRAMUSCULAR | Status: AC
  Administered 2017-10-06: 4 mg via INTRAVENOUS
  Filled 2017-10-06: qty 2

## 2017-10-06 NOTE — ED Notes (Signed)
Patient discharged to home per MD order. Patient in stable condition, and deemed medically cleared by ED provider for discharge. Discharge instructions reviewed with patient/family using "Teach Back"; verbalized understanding of medication education and administration, and information about follow-up care. Denies further concerns. ° °

## 2017-10-06 NOTE — ED Provider Notes (Signed)
[facility] Emergency Department Provider Note  ____________________________________________  Time seen: Approximately 3:33 PM  I have reviewed the triage vital signs and the nursing notes.   HISTORY  Chief Complaint Abdominal Pain  Level 5 Caveat: Portions of the History and Physical are unable to be obtained due to patient being a poor historian   HPI Nicolas Shaw is a 32 y.o. male who complains of generalized abdominal pain along with a written list of 28 other complaints. she reports the symptoms have been present for at least a year and he comes today because they seem a little bit worse. Denies any new complaints. Unable to describe any significant changes from his baseline. Notes that he's been evaluated previously for similar symptoms without any specific diagnosis.     Past Medical History:  Diagnosis Date  . Asthma   . Complication of anesthesia    HARD TO WAKE UP AFTER CYSTO 01-20-15  . Depression   . Drug abuse, cocaine type (HCC)    + UDS ON 01-20-15  . History of kidney stones   . History of stomach ulcers      Patient Active Problem List   Diagnosis Date Noted  . Flank pain 02/05/2015  . Hydronephrosis, right 02/05/2015  . Renal mass, right 02/05/2015  . Nephrolithiasis 01/20/2015  . Compulsive tobacco user syndrome 06/17/2013     Past Surgical History:  Procedure Laterality Date  . CYSTOSCOPY WITH STENT PLACEMENT Right 01/20/2015   Procedure: CYSTOSCOPY WITH STENT PLACEMENT;  Surgeon: Malen Gauze, MD;  Location: [facility] ORS;  Service: Urology;  Laterality: Right;  . CYSTOSCOPY WITH STENT PLACEMENT Right 02/09/2015   Procedure: CYSTOSCOPY WITH STENT PLACEMENT;  Surgeon: Hildred Laser, MD;  Location: [facility] ORS;  Service: Urology;  Laterality: Right;  . URETEROSCOPY WITH HOLMIUM LASER LITHOTRIPSY Right 02/09/2015   Procedure: URETEROSCOPY WITH HOLMIUM LASER LITHOTRIPSY;  Surgeon: Hildred Laser, MD;  Location: [facility] ORS;   Service: Urology;  Laterality: Right;     Prior to Admission medications   Not on File     Allergies Silver   Family History  Problem Relation Age of Onset  . Prostate cancer Father   . Kidney disease Maternal Grandmother        kidney cancer  . Breast cancer Maternal Grandmother   . Diabetes Maternal Grandmother   . Gout Maternal Grandmother   . Breast cancer Unknown        aunt  . Gout Unknown        aunt    Social History Social History   Tobacco Use  . Smoking status: Current Every Day Smoker    Packs/day: 0.50    Types: Cigarettes  . Smokeless tobacco: Never Used  Substance Use Topics  . Alcohol use: Yes    Alcohol/week: 0.0 oz  . Drug use: Yes    Types: "Crack" cocaine    Review of Systems  Constitutional:   No fever positive chills.  ENT:   Positive sore throat. No rhinorrhea. Cardiovascular:   No chest pain or syncope. Respiratory:   No dyspnea positive cough. Gastrointestinal:   positive abdominal pain as above with occasional vomiting. No diarrhea or constipation  Musculoskeletal:   Negative for focal pain or swelling. Positive myalgias All other systems reviewed and are negative except as documented above in ROS and HPI.  ____________________________________________   PHYSICAL EXAM:  VITAL SIGNS: ED Triage Vitals  Enc Vitals Group     BP 10/06/17 1343 (!) 161/96  Pulse Rate 10/06/17 1343 100     Resp 10/06/17 1343 16     Temp 10/06/17 1343 98.6 F (37 C)     Temp Source 10/06/17 1343 Oral     SpO2 10/06/17 1343 98 %     Weight 10/06/17 1344 210 lb (95.3 kg)     Height 10/06/17 1344 6' (1.829 m)     Head Circumference --      Peak Flow --      Pain Score 10/06/17 1344 8     Pain Loc --      Pain Edu? --      Excl. in GC? --     Vital signs reviewed, nursing assessments reviewed.   Constitutional:   Alert and oriented. Well appearing and in no distress. Eyes:   Conjunctivae are normal. EOMI. PERRL. ENT      Head:    Normocephalic and atraumatic.      Nose:   No congestion/rhinnorhea.       Mouth/Throat:   dry mucous membranes, no pharyngeal erythema. No peritonsillar mass.       Neck:   No meningismus. Full ROM. Hematological/Lymphatic/Immunilogical:   No cervical lymphadenopathy. Cardiovascular:   RRR. Symmetric bilateral radial and DP pulses.  No murmurs.  Respiratory:   Normal respiratory effort without tachypnea/retractions. Breath sounds are clear and equal bilaterally. No wheezes/rales/rhonchi. Gastrointestinal:   Soft with diffuse tenderness, nonfocal. Non distended. There is no CVA tenderness.  No rebound, rigidity, or guarding.  Musculoskeletal:   Normal range of motion in all extremities. No joint effusions.  No lower extremity tenderness.  No edema. Neurologic:   Normal speech and language.  Motor grossly intact. No acute focal neurologic deficits are appreciated.  Skin:    Skin is warm, dry and intact. No rash noted.  No petechiae, purpura, or bullae.  ____________________________________________    LABS (pertinent positives/negatives) (all labs ordered are listed, but only abnormal results are displayed) Labs Reviewed  COMPREHENSIVE METABOLIC PANEL - Abnormal; Notable for the following components:      Result Value   Sodium 133 (*)    Potassium 3.2 (*)    Chloride 98 (*)    Glucose, Bld 149 (*)    All other components within normal limits  URINALYSIS, COMPLETE (UACMP) WITH MICROSCOPIC - Abnormal; Notable for the following components:   Color, Urine YELLOW (*)    APPearance CLEAR (*)    Hgb urine dipstick SMALL (*)    Ketones, ur 5 (*)    All other components within normal limits  CBC WITH DIFFERENTIAL/PLATELET - Abnormal; Notable for the following components:   RBC 6.58 (*)    Hemoglobin 19.2 (*)    HCT 56.1 (*)    All other components within normal limits  LIPASE, BLOOD    ____________________________________________   EKG    ____________________________________________    RADIOLOGY  Ct Abdomen Pelvis W Contrast  Result Date: 10/06/2017 CLINICAL DATA:  Pt arrived with left lower abdominal pain that has been going on for a unspecified period of time. Pt states that he has been to [facility] multiple times and "told nothing was wrong, given pain meds, then sent home." EXAM: CT ABDOMEN AND PELVIS WITH CONTRAST TECHNIQUE: Multidetector CT imaging of the abdomen and pelvis was performed using the standard protocol following bolus administration of intravenous contrast. CONTRAST:  ISOVUE-300 IOPAMIDOL (ISOVUE-300) INJECTION 61% COMPARISON:  CT 01/20/2015 FINDINGS: Lower chest: Lung bases are clear. Hepatobiliary: No focal hepatic lesion. No biliary duct  dilatation. Gallbladder is normal. Common bile duct is normal. Pancreas: Pancreas is normal. No ductal dilatation. No pancreatic inflammation. Spleen: Normal spleen Adrenals/urinary tract: Adrenal glands normal. Predominately simple fluid lesion in the mid cortex of the RIGHT kidney measures 20 mm compared to 19 mm on CT 2016. There is a small calcification along the wall of this cystic lesion. No ureterolithiasis. No obstructive uropathy. No bladder calculi Stomach/Bowel: Stomach, small bowel, appendix, and cecum are normal. The colon and rectosigmoid colon are normal. Vascular/Lymphatic: Abdominal aorta is normal caliber. No periportal or retroperitoneal adenopathy. No pelvic adenopathy. Reproductive: Prostate normal Other: No free fluid. Musculoskeletal: No aggressive osseous lesion. IMPRESSION: 1. No acute findings in the abdomen pelvis. 2. Cystic lesion in the RIGHT kidney is minimally changed from CT 2016 and favored benign mildly complex cyst. Consider MRI with without contrast for more definitive characterization. Electronically Signed   By: Genevive Bi M.D.   On: 10/06/2017 15:04     ____________________________________________   PROCEDURES Procedures  ____________________________________________  DIFFERENTIAL DIAGNOSIS   appendicitis, enteritis, viral syndrome, dehydration, intoxication  CLINICAL IMPRESSION / ASSESSMENT AND PLAN / ED COURSE  Pertinent labs & imaging results that were available during my care of the patient were reviewed by me and considered in my medical decision making (see chart for details).    patient well appearing, not in distress. Presents with many chronic complaints, no specific acute symptoms. Exam is nonfocal but does reveal abdominal tenderness. In the setting of his vague history and apparent severity of his pain, obtained a CT scan to evaluate for appendicitis or other occult intra-abdominal pathology that is not evident on exam. CT is unremarkable, no acute pathology. Does reveal a renal cysts which is benign in appearance. Initial be referred to urology and primary care.no evidence of infection on the urinalysis. Suitable for discharge home with symptomatic support.      ____________________________________________   FINAL CLINICAL IMPRESSION(S) / ED DIAGNOSES    Final diagnoses:  Dehydration  Malaise     ED Discharge Orders    None      Portions of this note were generated with dragon dictation software. Dictation errors may occur despite best attempts at proofreading.    Sharman Cheek, MD 10/06/17 [phone]

## 2017-10-06 NOTE — ED Notes (Signed)
Pt arrived with left lower abdominal pain that has been going on for a unspecified period of time. Pt states that he has been to [facility] multiple times and "told nothing was wrong, given pain meds, then sent home." Pt states that the pain has gotten worse recently and that nothing that he is taking has helped.

## 2017-10-06 NOTE — ED Triage Notes (Signed)
Pt in via POV with multiple complaints, reports symptoms have continued since he was last seen here a year ago.  Pt reports hx of kidney stones w/ lithotripsy.  Pt presents today with persistant right side abdominal pain, dizziness, migraine.  Pt appears pale, clammy, uncomfortable.  Vitals WDL.

## 2018-11-01 IMAGING — CT CT ABD-PELV W/ CM
2 of 4 series · 16 of 46 positions shown, 18 images · IV contrast (APPLIED)
Comparison: CT 01/20/2015

CLINICAL DATA: Pt arrived with left lower abdominal pain that has
been going on for a unspecified period of time. Pt states that he
has been to [facility] multiple times and "told nothing was
wrong, given pain meds, then sent home."

EXAM:
CT ABDOMEN AND PELVIS WITH CONTRAST
TECHNIQUE: Multidetector CT imaging of the abdomen and pelvis was performed
using the standard protocol following bolus administration of
intravenous contrast.
CONTRAST:  100mL SYCNFF-XVV IOPAMIDOL (SYCNFF-XVV) INJECTION 61%

[Series 2: routine abd/pel with · axial · 0.71mm/px · z∈[-514,-89]mm · 13 of 93 slices shown, 15 images]
[im 4/93  soft-tissue]
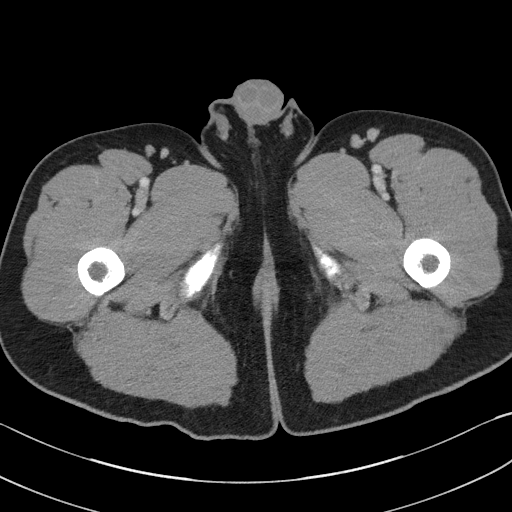
[im 4/93  bone]
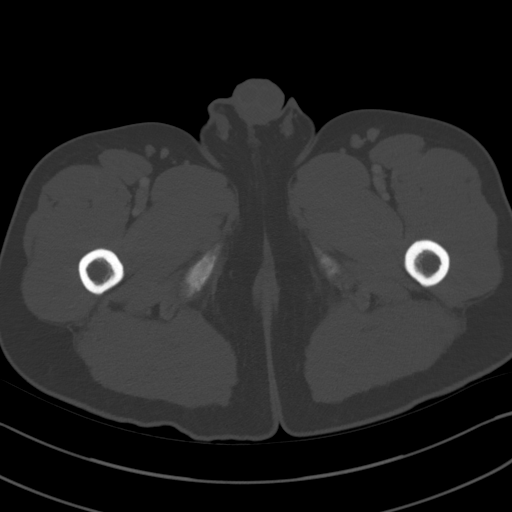
[im 12/93  soft-tissue]
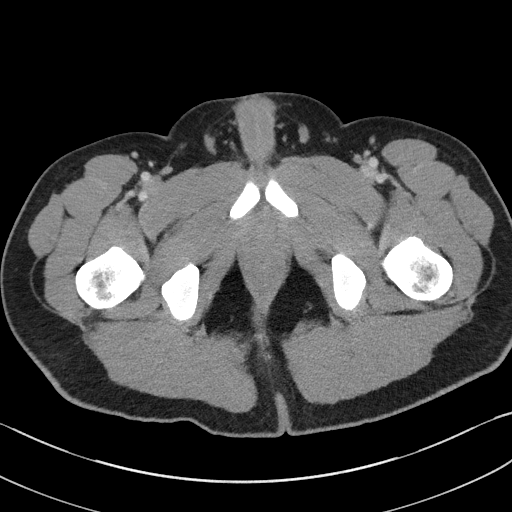
[im 19/93  soft-tissue]
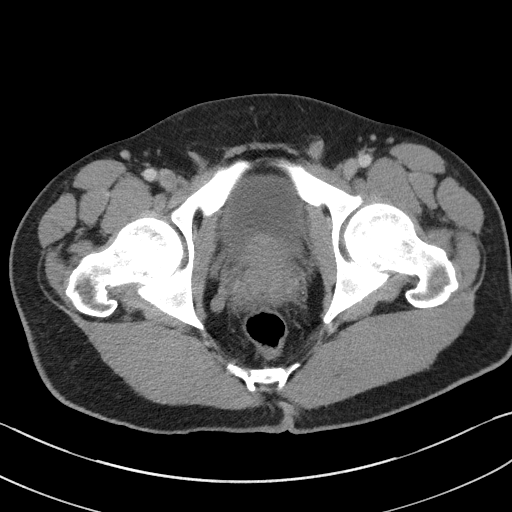
[im 26/93  soft-tissue]
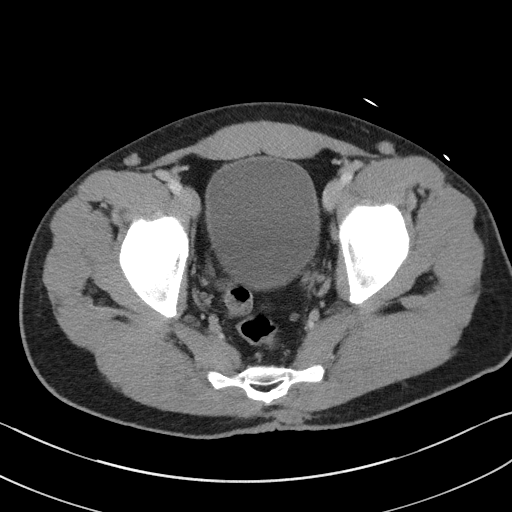
[im 34/93  soft-tissue]
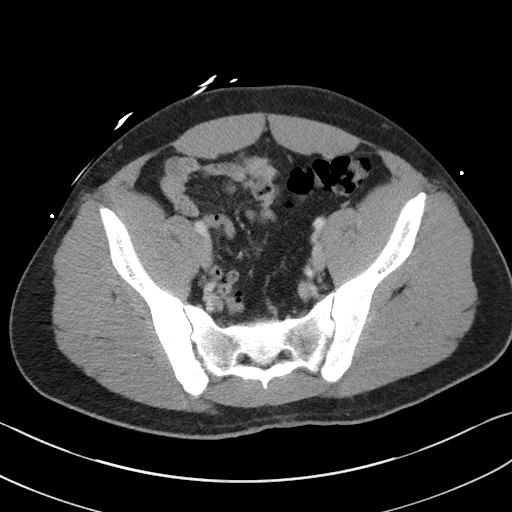
[im 41/93  soft-tissue]
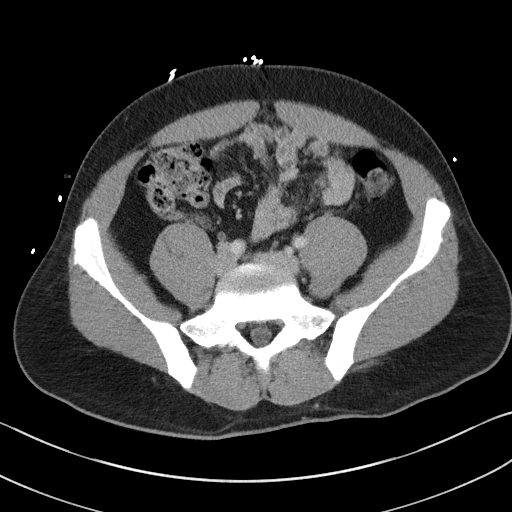
[im 48/93  soft-tissue]
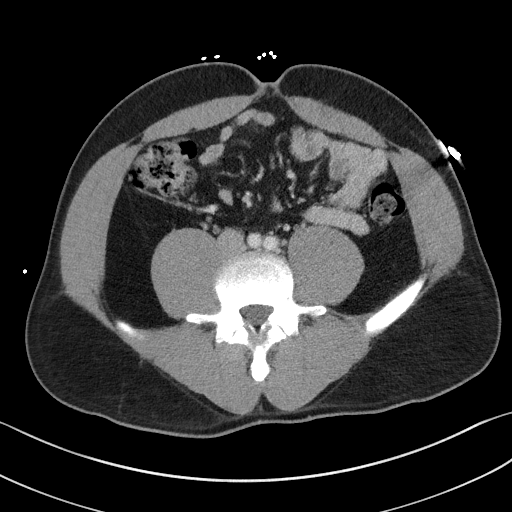
[im 52/93  soft-tissue]
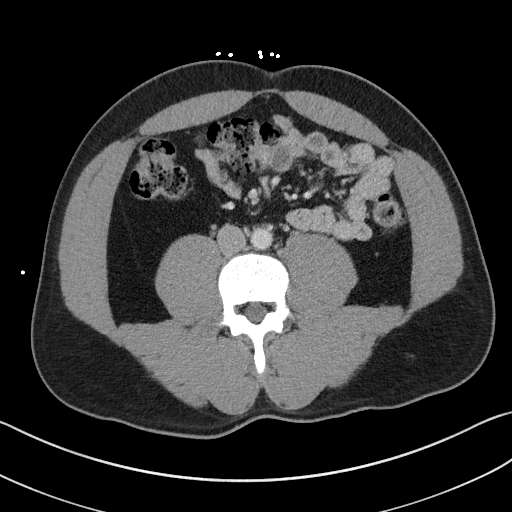
[im 59/93  soft-tissue]
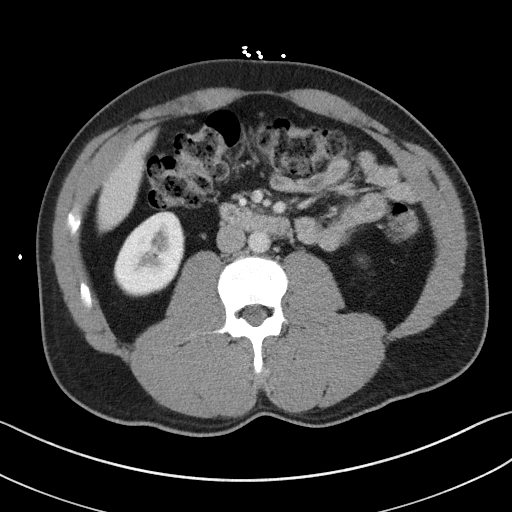
[im 59/93  bone]
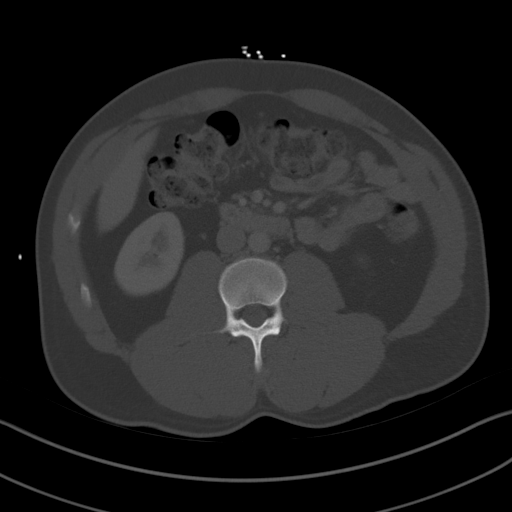
[im 67/93  soft-tissue]
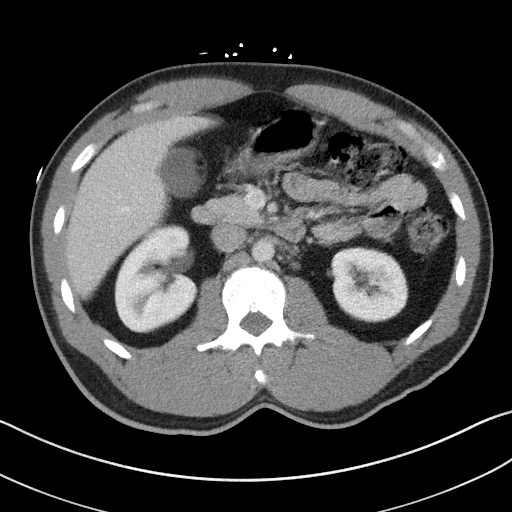
[im 74/93  soft-tissue]
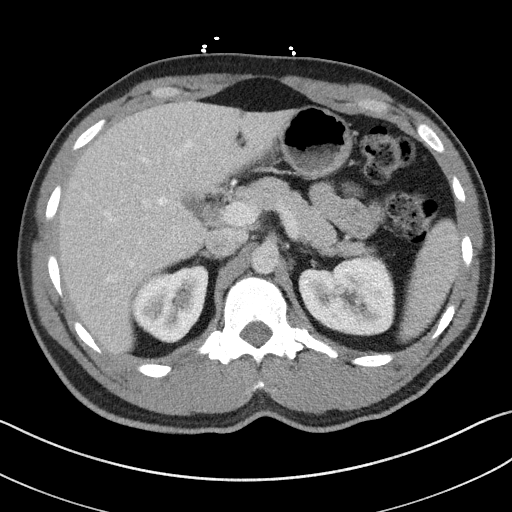
[im 81/93  soft-tissue]
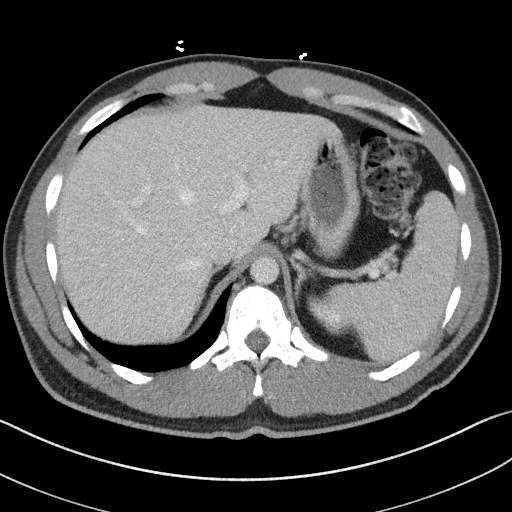
[im 89/93  soft-tissue]
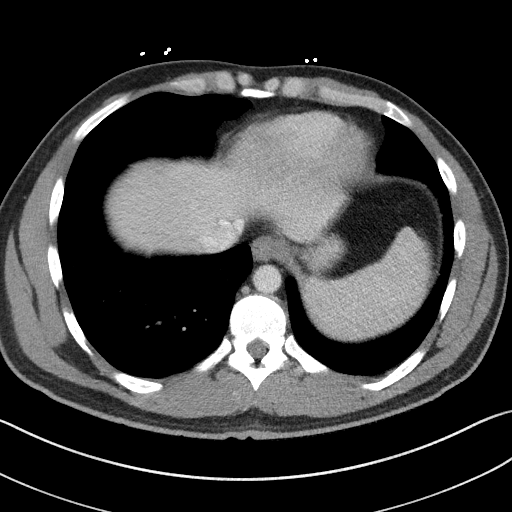

[Series 5: coronal st · coronal · 0.69mm/px · 3 of 95 slices shown]
[im 32/95  soft-tissue]
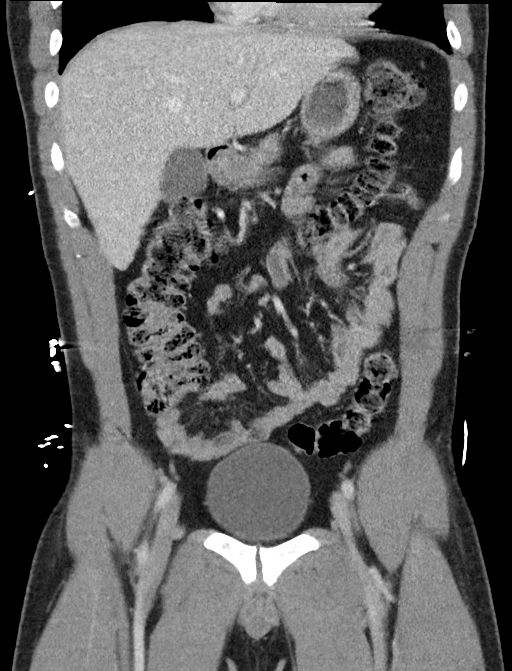
[im 42/95  soft-tissue]
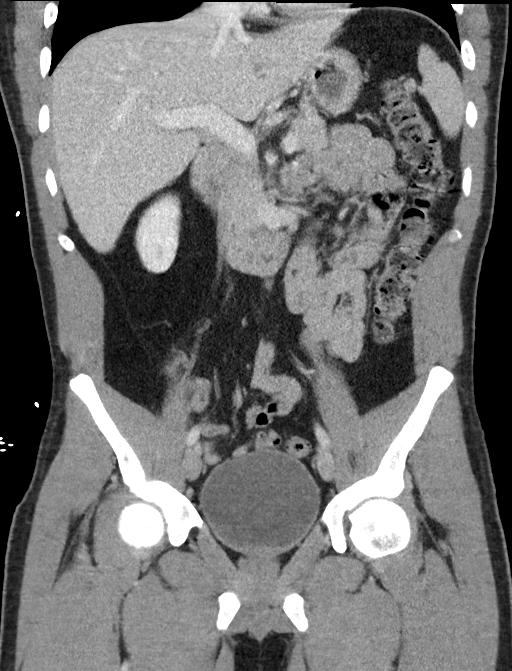
[im 53/95  soft-tissue]
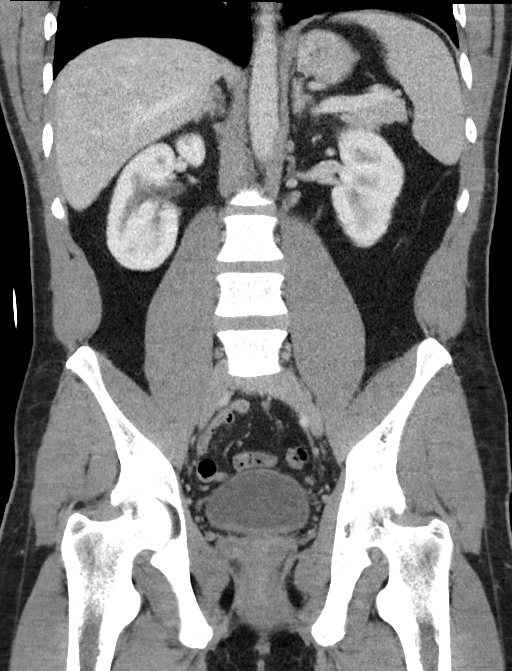

[16 of 46 positions shown; findings below may reference images not displayed]

FINDINGS: Lower chest: Lung bases are clear.

Hepatobiliary: No focal hepatic lesion. No biliary duct dilatation.
Gallbladder is normal. Common bile duct is normal.

Pancreas: Pancreas is normal. No ductal dilatation. No pancreatic
inflammation.

Spleen: Normal spleen

Adrenals/urinary tract: Adrenal glands normal. Predominately simple
fluid lesion in the mid cortex of the RIGHT kidney measures 20 mm
compared to 19 mm on CT 7998. There is a small calcification along
the wall of this cystic lesion. No ureterolithiasis. No obstructive
uropathy. No bladder calculi

Stomach/Bowel: Stomach, small bowel, appendix, and cecum are normal.
The colon and rectosigmoid colon are normal.

Vascular/Lymphatic: Abdominal aorta is normal caliber. No periportal
or retroperitoneal adenopathy. No pelvic adenopathy.

Reproductive: Prostate normal

Other: No free fluid.

Musculoskeletal: No aggressive osseous lesion.
IMPRESSION: 1. No acute findings in the abdomen pelvis.
2. Cystic lesion in the RIGHT kidney is minimally changed from CT
7998 and favored benign mildly complex cyst. Consider MRI with
without contrast for more definitive characterization.

## 2024-01-26 ENCOUNTER — Other Ambulatory Visit: Payer: Self-pay

## 2024-01-26 ENCOUNTER — Emergency Department
Admission: EM | Admit: 2024-01-26 | Discharge: 2024-01-26 | Disposition: A | Discharge status: Home / Self Care | Attending: Emergency Medicine | Admitting: Emergency Medicine

## 2024-01-26 ENCOUNTER — Encounter: Payer: Self-pay | Admitting: *Deleted

## 2024-01-26 DIAGNOSIS — K047 Periapical abscess without sinus: Secondary | ICD-10-CM | POA: Insufficient documentation

## 2024-01-26 DIAGNOSIS — K0889 Other specified disorders of teeth and supporting structures: Secondary | ICD-10-CM

## 2024-01-26 DIAGNOSIS — J45909 Unspecified asthma, uncomplicated: Secondary | ICD-10-CM | POA: Diagnosis not present

## 2024-01-26 MED ORDER — ACETAMINOPHEN 500 MG PO TABS
1000.0000 mg | ORAL_TABLET | Freq: Once | ORAL | Status: AC
  Administered 2024-01-26: 1000 mg via ORAL
  Filled 2024-01-26: qty 2

## 2024-01-26 MED ORDER — HYDROCODONE-ACETAMINOPHEN 5-325 MG PO TABS: 1.0000 | ORAL_TABLET | ORAL | 0 refills | Status: AC | PRN

## 2024-01-26 MED ORDER — CLINDAMYCIN HCL 150 MG PO CAPS
300.0000 mg | ORAL_CAPSULE | Freq: Once | ORAL | Status: AC
  Administered 2024-01-26: 300 mg via ORAL
  Filled 2024-01-26: qty 2

## 2024-01-26 MED ORDER — CLINDAMYCIN HCL 300 MG PO CAPS
300.0000 mg | ORAL_CAPSULE | Freq: Three times a day (TID) | ORAL | 0 refills | Status: AC
Start: 2024-01-26 — End: 2024-02-05

## 2024-01-26 NOTE — ED Triage Notes (Signed)
 Pt reports lower right dental pain for 12 hours. Has had chills, unsure of fevers. No meds PTA.

## 2024-01-26 NOTE — ED Provider Notes (Signed)
 [facility] Provider Note    Event Date/Time   First MD Initiated Contact with Patient 01/26/24 [phone]     (approximate)  History   Chief Complaint: Dental Pain  HPI  Nicolas Shaw is a 38 y.o. male with a past medical history of asthma, presents to the emergency department for right sided dental pain.  According to the patient since earlier this morning (approximately 20 hours ago) he has been experiencing progressively worsening pain in the right lower molars.  Patient states he has had dental infections to this area previously.  Does not have a dentist currently but has Secretary/administrator.  No fever.  Physical Exam   Triage Vital Signs: ED Triage Vitals  Encounter Vitals Group     BP 01/26/24 0248 (!) 156/101     Girls Systolic BP Percentile --      Girls Diastolic BP Percentile --      Boys Systolic BP Percentile --      Boys Diastolic BP Percentile --      Pulse Rate 01/26/24 0248 (!) 131     Resp 01/26/24 0248 18     Temp 01/26/24 0248 99.1 F (37.3 C)     Temp Source 01/26/24 0248 Oral     SpO2 01/26/24 0248 96 %     Weight --      Height --      Head Circumference --      Peak Flow --      Pain Score 01/26/24 0247 7     Pain Loc --      Pain Education --      Exclude from Growth Chart --     Most recent vital signs: Vitals:   01/26/24 0248 01/26/24 0256  BP: (!) 156/101   Pulse: (!) 131   Resp: 18   Temp: 99.1 F (37.3 C)   SpO2: 96% 97%    General: Awake, no distress.  CV:  Good peripheral perfusion.  Regular rhythm rate around 120 bpm. Resp:  Normal effort.  Abd:  No distention.  Other:  Patient has moderate tenderness palpation around the right lower wisdom tooth possible dental caries but no abscess to this area.  No fullness under the jaw.   ED Results / Procedures / Treatments   MEDICATIONS ORDERED IN ED: Medications  clindamycin  (CLEOCIN ) capsule 300 mg (has no administration in time range)  acetaminophen  (TYLENOL )  tablet 1,000 mg (has no administration in time range)     IMPRESSION / MDM / ASSESSMENT AND PLAN / ED COURSE  I reviewed the triage vital signs and the nursing notes.  Patient's presentation is most consistent with acute illness / injury with system symptoms.  Patient presents to the emergency department for likely dental infection.  No abscess on exam.  Afebrile.  Patient heart rate is elevated currently around 120 bpm.  Will dose Tylenol  as the patient has not taken any over-the-counter pain medication.  He drove himself to the emergency department.  No other concerning findings on physical exam.  Will send the patient home on clindamycin  and I will call in a short course of some pain medication for the patient to pick up tomorrow.  Patient has dental insurance and states he will call a dentist Monday morning to get in.  I discussed return precautions.  FINAL CLINICAL IMPRESSION(S) / ED DIAGNOSES   Dental infection   Note:  This document was prepared using Dragon voice recognition software and may include unintentional  dictation errors.   Dorothyann Drivers, MD 01/26/24 [phone]

## 2024-01-26 NOTE — Discharge Instructions (Signed)
 Please take your antibiotic as prescribed.  Please take your pain medication as needed but only as written.  Do not drink alcohol or drive while taking pain medication.  Please call a dentist Monday morning to arrange a follow-up appointment soon as possible preferably within the next couple days.  Return to the emergency department for any worsening pain any swelling of your face or jaw or any fever.
